# Patient Record
Sex: Female | Born: 1990 | Race: Black or African American | Hispanic: No | Marital: Single | Smoking: Never smoker
Health system: Southern US, Community
[De-identification: ages and names within clinical notes are randomized; demographics above are authoritative.]

## PROBLEM LIST (undated history)

## (undated) DIAGNOSIS — T7840XA Allergy, unspecified, initial encounter: Secondary | ICD-10-CM

## (undated) HISTORY — DX: Allergy, unspecified, initial encounter: T78.40XA

## (undated) HISTORY — PX: WISDOM TOOTH EXTRACTION: SHX21

---

## 2011-07-23 LAB — HM PAP SMEAR

## 2012-11-02 ENCOUNTER — Encounter: Payer: Self-pay | Admitting: *Deleted

## 2012-11-03 ENCOUNTER — Encounter: Payer: Self-pay | Admitting: Nurse Practitioner

## 2012-11-03 ENCOUNTER — Ambulatory Visit (INDEPENDENT_AMBULATORY_CARE_PROVIDER_SITE_OTHER): Payer: 59 | Admitting: Nurse Practitioner

## 2012-11-03 VITALS — BP 122/70 | HR 74 | Resp 16 | Ht 67.0 in | Wt 222.2 lb

## 2012-11-03 DIAGNOSIS — Z01419 Encounter for gynecological examination (general) (routine) without abnormal findings: Secondary | ICD-10-CM

## 2012-11-03 DIAGNOSIS — Z113 Encounter for screening for infections with a predominantly sexual mode of transmission: Secondary | ICD-10-CM

## 2012-11-03 DIAGNOSIS — Z309 Encounter for contraceptive management, unspecified: Secondary | ICD-10-CM

## 2012-11-03 DIAGNOSIS — IMO0001 Reserved for inherently not codable concepts without codable children: Secondary | ICD-10-CM

## 2012-11-03 DIAGNOSIS — Z0000 Encounter for general adult medical examination without abnormal findings: Secondary | ICD-10-CM

## 2012-11-03 LAB — POCT URINALYSIS DIPSTICK
Spec Grav, UA: 1.015
pH, UA: 6

## 2012-11-03 MED ORDER — NORETHINDRONE ACET-ETHINYL EST 1.5-30 MG-MCG PO TABS: 1.0000 | ORAL_TABLET | Freq: Every day | ORAL | Status: DC

## 2012-11-03 NOTE — Patient Instructions (Signed)
General topics  Next pap or exam is  due in 1 year Take a Women's multivitamin Take 1200 mg. of calcium daily - prefer dietary If any concerns in interim to call back  Breast Self-Awareness Practicing breast self-awareness may pick up problems early, prevent significant medical complications, and possibly save your life. By practicing breast self-awareness, you can become familiar with how your breasts look and feel and if your breasts are changing. This allows you to notice changes early. It can also offer you some reassurance that your breast health is good. One way to learn what is normal for your breasts and whether your breasts are changing is to do a breast self-exam. If you find a lump or something that was not present in the past, it is best to contact your caregiver right away. Other findings that should be evaluated by your caregiver include nipple discharge, especially if it is bloody; skin changes or reddening; areas where the skin seems to be pulled in (retracted); or new lumps and bumps. Breast pain is seldom associated with cancer (malignancy), but should also be evaluated by a caregiver. BREAST SELF-EXAM The best time to examine your breasts is 5 7 days after your menstrual period is over.  ExitCare Patient Information 2013 [employer].   Exercise to Stay Healthy Exercise helps you become and stay healthy. EXERCISE IDEAS AND TIPS Choose exercises that:  You enjoy.  Fit into your day. You do not need to exercise really hard to be healthy. You can do exercises at a slow or medium level and stay healthy. You can:  Stretch before and after working out.  Try yoga, Pilates, or tai chi.  Lift weights.  Walk fast, swim, jog, run, climb stairs, bicycle, dance, or rollerskate.  Take aerobic classes. Exercises that burn about 150 calories:  Running 1  miles in 15 minutes.  Playing volleyball for 45 to 60 minutes.  Washing and waxing a car for 45 to 60  minutes.  Playing touch football for 45 minutes.  Walking 1  miles in 35 minutes.  Pushing a stroller 1  miles in 30 minutes.  Playing basketball for 30 minutes.  Raking leaves for 30 minutes.  Bicycling 5 miles in 30 minutes.  Walking 2 miles in 30 minutes.  Dancing for 30 minutes.  Shoveling snow for 15 minutes.  Swimming laps for 20 minutes.  Walking up stairs for 15 minutes.  Bicycling 4 miles in 15 minutes.  Gardening for 30 to 45 minutes.  Jumping rope for 15 minutes.  Washing windows or floors for 45 to 60 minutes. Document Released: 07/13/2010 Document Revised: 09/02/2011 Document Reviewed: 07/13/2010 ExitCare Patient Information 2013 [employer].   Other topics ( that may be useful information):    Sexually Transmitted Disease Sexually transmitted disease (STD) refers to any infection that is passed from person to person during sexual activity. This may happen by way of saliva, semen, blood, vaginal mucus, or urine. Common STDs include:  Gonorrhea.  Chlamydia.  Syphilis.  HIV/AIDS.  Genital herpes.  Hepatitis B and C.  Trichomonas.  Human papillomavirus (HPV).  Pubic lice. CAUSES  An STD may be spread by bacteria, virus, or parasite. A person can get an STD by:  Sexual intercourse with an infected person.  Sharing sex toys with an infected person.  Sharing needles with an infected person.  Having intimate contact with the genitals, mouth, or rectal areas of an infected person. SYMPTOMS  Some people may not have any symptoms, but   they can still pass the infection to others. Different STDs have different symptoms. Symptoms include:  Painful or bloody urination.  Pain in the pelvis, abdomen, vagina, anus, throat, or eyes.  Skin rash, itching, irritation, growths, or sores (lesions). These usually occur in the genital or anal area.  Abnormal vaginal discharge.  Penile discharge in men.  Soft, flesh-colored skin growths in the  genital or anal area.  Fever.  Pain or bleeding during sexual intercourse.  Swollen glands in the groin area.  Yellow skin and eyes (jaundice). This is seen with hepatitis. DIAGNOSIS  To make a diagnosis, your caregiver may:  Take a medical history.  Perform a physical exam.  Take a specimen (culture) to be examined.  Examine a sample of discharge under a microscope.  Perform blood test TREATMENT   Chlamydia, gonorrhea, trichomonas, and syphilis can be cured with antibiotic medicine.  Genital herpes, hepatitis, and HIV can be treated, but not cured, with prescribed medicines. The medicines will lessen the symptoms.  Genital warts from HPV can be treated with medicine or by freezing, burning (electrocautery), or surgery. Warts may come back.  HPV is a virus and cannot be cured with medicine or surgery.However, abnormal areas may be followed very closely by your caregiver and may be removed from the cervix, vagina, or vulva through office procedures or surgery. If your diagnosis is confirmed, your recent sexual partners need treatment. This is true even if they are symptom-free or have a negative culture or evaluation. They should not have sex until their caregiver says it is okay. HOME CARE INSTRUCTIONS  All sexual partners should be informed, tested, and treated for all STDs.  Take your antibiotics as directed. Finish them even if you start to feel better.  Only take over-the-counter or prescription medicines for pain, discomfort, or fever as directed by your caregiver.  Rest.  Eat a balanced diet and drink enough fluids to keep your urine clear or pale yellow.  Do not have sex until treatment is completed and you have followed up with your caregiver. STDs should be checked after treatment.  Keep all follow-up appointments, Pap tests, and blood tests as directed by your caregiver.  Only use latex condoms and water-soluble lubricants during sexual activity. Do not use  petroleum jelly or oils.  Avoid alcohol and illegal drugs.  Get vaccinated for HPV and hepatitis. If you have not received these vaccines in the past, talk to your caregiver about whether one or both might be right for you.  Avoid risky sex practices that can break the skin. The only way to avoid getting an STD is to avoid all sexual activity.Latex condoms and dental dams (for oral sex) will help lessen the risk of getting an STD, but will not completely eliminate the risk. SEEK MEDICAL CARE IF:   You have a fever.  You have any new or worsening symptoms. Document Released: 08/31/2002 Document Revised: 09/02/2011 Document Reviewed: 09/07/2010 ExitCare Patient Information 2013 [employer].    Domestic Abuse You are being battered or abused if someone close to you hits, pushes, or physically hurts you in any way. You also are being abused if you are forced into activities. You are being sexually abused if you are forced to have sexual contact of any kind. You are being emotionally abused if you are made to feel worthless or if you are constantly threatened. It is important to remember that help is available. No one has the right to abuse you. PREVENTION OF FURTHER   ABUSE  Learn the warning signs of danger. This varies with situations but may include: the use of alcohol, threats, isolation from friends and family, or forced sexual contact. Leave if you feel that violence is going to occur.  If you are attacked or beaten, report it to the police so the abuse is documented. You do not have to press charges. The police can protect you while you or the attackers are leaving. Get the officer's name and badge number and a copy of the report.  Find someone you can trust and tell them what is happening to you: your caregiver, a nurse, clergy member, close friend or family member. Feeling ashamed is natural, but remember that you have done nothing wrong. No one deserves abuse. Document Released:  06/07/2000 Document Revised: 09/02/2011 Document Reviewed: 08/16/2010 ExitCare Patient Information 2013 [employer].    How Much is Too Much Alcohol? Drinking too much alcohol can cause injury, accidents, and health problems. These types of problems can include:   Car crashes.  Falls.  Family fighting (domestic violence).  Drowning.  Fights.  Injuries.  Burns.  Damage to certain organs.  Having a baby with birth defects. ONE DRINK CAN BE TOO MUCH WHEN YOU ARE:  Working.  Pregnant or breastfeeding.  Taking medicines. Ask your doctor.  Driving or planning to drive. If you or someone you know has a drinking problem, get help from a doctor.  Document Released: 04/06/2009 Document Revised: 09/02/2011 Document Reviewed: 04/06/2009 ExitCare Patient Information 2013 [employer].   Smoking Hazards Smoking cigarettes is extremely bad for your health. Tobacco smoke has over 200 known poisons in it. There are over 60 chemicals in tobacco smoke that cause cancer. Some of the chemicals found in cigarette smoke include:   Cyanide.  Benzene.  Formaldehyde.  Methanol (wood alcohol).  Acetylene (fuel used in welding torches).  Ammonia. Cigarette smoke also contains the poisonous gases nitrogen oxide and carbon monoxide.  Cigarette smokers have an increased risk of many serious medical problems and Smoking causes approximately:  90% of all lung cancer deaths in men.  80% of all lung cancer deaths in women.  90% of deaths from chronic obstructive lung disease. Compared with nonsmokers, smoking increases the risk of:  Coronary heart disease by 2 to 4 times.  Stroke by 2 to 4 times.  Men developing lung cancer by 23 times.  Women developing lung cancer by 13 times.  Dying from chronic obstructive lung diseases by 12 times.  . Smoking is the most preventable cause of death and disease in our society.  WHY IS SMOKING ADDICTIVE?  Nicotine is the chemical  agent in tobacco that is capable of causing addiction or dependence.  When you smoke and inhale, nicotine is absorbed rapidly into the bloodstream through your lungs. Nicotine absorbed through the lungs is capable of creating a powerful addiction. Both inhaled and non-inhaled nicotine may be addictive.  Addiction studies of cigarettes and spit tobacco show that addiction to nicotine occurs mainly during the teen years, when young people begin using tobacco products. WHAT ARE THE BENEFITS OF QUITTING?  There are many health benefits to quitting smoking.   Likelihood of developing cancer and heart disease decreases. Health improvements are seen almost immediately.  Blood pressure, pulse rate, and breathing patterns start returning to normal soon after quitting. QUITTING SMOKING   American Lung Association - 1-800-LUNGUSA  American Cancer Society - 1-800-ACS-2345 Document Released: 07/18/2004 Document Revised: 09/02/2011 Document Reviewed: 03/22/2009 ExitCare Patient Information 2013 ExitCare[employer]   LL[employer].   Stress Management Stress is a state of physical or mental tension that often results from changes in your life or normal routine. Some common causes of stress are:  Death of a loved one.  Injuries or severe illnesses.  Getting fired or changing jobs.  Moving into a new home. Other causes may be:  Sexual problems.  Business or financial losses.  Taking on a large debt.  Regular conflict with someone at home or at work.  Constant tiredness from lack of sleep. It is not just bad things that are stressful. It may be stressful to:  Win the lottery.  Get married.  Buy a new car. The amount of stress that can be easily tolerated varies from person to person. Changes generally cause stress, regardless of the types of change. Too much stress can affect your health. It may lead to physical or emotional problems. Too little stress (boredom) may also become stressful. SUGGESTIONS TO  REDUCE STRESS:  Talk things over with your family and friends. It often is helpful to share your concerns and worries. If you feel your problem is serious, you may want to get help from a professional counselor.  Consider your problems one at a time instead of lumping them all together. Trying to take care of everything at once may seem impossible. List all the things you need to do and then start with the most important one. Set a goal to accomplish 2 or 3 things each day. If you expect to do too many in a single day you will naturally fail, causing you to feel even more stressed.  Do not use alcohol or drugs to relieve stress. Although you may feel better for a short time, they do not remove the problems that caused the stress. They can also be habit forming.  Exercise regularly - at least 3 times per week. Physical exercise can help to relieve that "uptight" feeling and will relax you.  The shortest distance between despair and hope is often a good night's sleep.  Go to bed and get up on time allowing yourself time for appointments without being rushed.  Take a short "time-out" period from any stressful situation that occurs during the day. Close your eyes and take some deep breaths. Starting with the muscles in your face, tense them, hold it for a few seconds, then relax. Repeat this with the muscles in your neck, shoulders, hand, stomach, back and legs.  Take good care of yourself. Eat a balanced diet and get plenty of rest.  Schedule time for having fun. Take a break from your daily routine to relax. HOME CARE INSTRUCTIONS   Call if you feel overwhelmed by your problems and feel you can no longer manage them on your own.  Return immediately if you feel like hurting yourself or someone else. Document Released: 12/04/2000 Document Revised: 09/02/2011 Document Reviewed: 07/27/2007 ExitCare Patient Information 2013 [employer].   

## 2012-11-03 NOTE — Progress Notes (Signed)
22 y.o. G0P0 Single African American Fe here for annual exam.  Menses are regular lasting about 4 days. Same partner for 5 years.   Patient's last menstrual period was 10/21/2012.          Sexually active: yes  The current method of family planning is OCP (estrogen/progesterone).    Exercising: no  The patient does not participate in regular exercise at present. Smoker:  no  Health Maintenance: Pap:  07/23/2011  ASCUS with negative HPV TDaP:  2009 Gardasil completed 12/2010 Labs: Hgb-12.1   does not have a smoking history on file. She has never used smokeless tobacco. She reports that she drinks about 0.6 ounces of alcohol per week. She reports that she does not use illicit drugs.  History reviewed. No pertinent past medical history.  Past Surgical History  Procedure Laterality Date  . Wisdom tooth extraction      Current Outpatient Prescriptions  Medication Sig Dispense Refill  . Norethindrone Acetate-Ethinyl Estradiol (LOESTRIN 1.5/30, 21,) 1.5-30 MG-MCG tablet Take 1 tablet by mouth daily.       No current facility-administered medications for this visit.    Family History  Problem Relation Age of Onset  . Cancer Mother   . Hypertension Father     ROS:  Pertinent items are noted in HPI.  Otherwise, a comprehensive ROS was negative.  Exam:   BP 122/70  Pulse 74  Resp 16  Ht 5\' 7"  (1.702 m)  Wt 222 lb 3.2 oz (100.789 kg)  BMI 34.79 kg/m2  LMP 10/21/2012 Height: 5\' 7"  (170.2 cm)  Ht Readings from Last 3 Encounters:  11/03/12 5\' 7"  (1.702 m)    General appearance: alert, cooperative and appears stated age Head: Normocephalic, without obvious abnormality, atraumatic Neck: no adenopathy, supple, symmetrical, trachea midline and thyroid normal to inspection and palpation Lungs: clear to auscultation bilaterally Breasts: normal appearance, no masses or tenderness Heart: regular rate and rhythm Abdomen: soft, non-tender; no masses,  no organomegaly Extremities:  extremities normal, atraumatic, no cyanosis or edema Skin: Skin color, texture, turgor normal. No rashes or lesions Lymph nodes: Cervical, supraclavicular, and axillary nodes normal. No abnormal inguinal nodes palpated Neurologic: Grossly normal   Pelvic: External genitalia:  no lesions              Urethra:  normal appearing urethra with no masses, tenderness or lesions              Bartholin's and Skene's: normal                 Vagina: normal appearing vagina with normal color and discharge, no lesions              Cervix: anteverted              Pap taken: yes Bimanual Exam:  Uterus:  normal size, contour, position, consistency, mobility, non-tender              Adnexa: no mass, fullness, tenderness               Rectovaginal: Confirms               Anus:  normal sphincter tone, no lesions  A:  Well Woman with normal exam  Contraception  R/O STD's  History of ASCUS with neg HR HPV 06/2011  P:   Pap smear as per guidelines   Call with labs  Refilled OCP - Loestrin for 1 year  return annually or prn  An After  Visit Summary was printed and given to the patient.

## 2012-11-04 LAB — STD PANEL
HIV: NONREACTIVE
Hepatitis B Surface Ag: NEGATIVE

## 2012-11-04 NOTE — Progress Notes (Signed)
Encounter reviewed by Dr. Brook Silva.  

## 2012-11-05 ENCOUNTER — Telehealth: Payer: Self-pay | Admitting: *Deleted

## 2012-11-05 LAB — IPS N GONORRHOEA AND CHLAMYDIA BY PCR

## 2012-11-05 NOTE — Telephone Encounter (Signed)
Message copied by Osie Bond on Thu Nov 05, 2012  2:58 PM ------      Message from: Ria Comment R      Created: Thu Nov 05, 2012  8:25 AM       Hold for [facility] ------

## 2012-11-05 NOTE — Telephone Encounter (Signed)
Pt is aware of all negative results.  

## 2012-11-10 LAB — IPS PAP TEST WITH REFLEX TO HPV

## 2013-05-10 ENCOUNTER — Ambulatory Visit: Payer: 59 | Admitting: Family Medicine

## 2013-05-10 VITALS — BP 120/70 | HR 103 | Temp 99.1°F | Resp 18 | Ht 67.5 in | Wt 258.0 lb

## 2013-05-10 DIAGNOSIS — J029 Acute pharyngitis, unspecified: Secondary | ICD-10-CM

## 2013-05-10 DIAGNOSIS — J039 Acute tonsillitis, unspecified: Secondary | ICD-10-CM

## 2013-05-10 DIAGNOSIS — R1310 Dysphagia, unspecified: Secondary | ICD-10-CM

## 2013-05-10 LAB — POCT RAPID STREP A (OFFICE): Rapid Strep A Screen: NEGATIVE

## 2013-05-10 MED ORDER — LIDOCAINE VISCOUS 2 % MT SOLN: 10.0000 mL | OROMUCOSAL | Status: DC | PRN

## 2013-05-10 MED ORDER — AMOXICILLIN 500 MG PO CAPS: 500.0000 mg | ORAL_CAPSULE | Freq: Three times a day (TID) | ORAL | Status: DC

## 2013-05-10 MED ORDER — METHYLPREDNISOLONE (PAK) 4 MG PO TABS: ORAL_TABLET | ORAL | Status: DC

## 2013-05-10 NOTE — Progress Notes (Signed)
[facility]:  Office Visit  Chief Complaint:  Chief Complaint  Patient presents with  . Sore Throat    x 2 days    HPI: Claudia Powers is a 22 y.o. female who is here for  4 day history of sore throat , deneis fevers, cough, dysphagia and also odynophagia with solids and liquids, no facial pain, + ear pressure/pop/pain x 2 days. Has tried cold and flu itc meds and also lozenges, without releif. Gargled with salt water. 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 Nonsmoker, + allergies, no asthma.   History reviewed. No pertinent past medical history. Past Surgical History  Procedure Laterality Date  . Wisdom tooth extraction     History   Social History  . Marital Status: Single    Spouse Name: N/A    Number of Children: N/A  . Years of Education: N/A   Social History Main Topics  . Smoking status: Never Smoker   . Smokeless tobacco: Never Used  . Alcohol Use: 0.6 oz/week    1 Shots of liquor per week  . Drug Use: No  . Sexual Activity: No   Other Topics Concern  . None   Social History Narrative  . None   Family History  Problem Relation Age of Onset  . Cancer Mother 70    Uterine cancer, PE  . Hypertension Father   . Bladder Cancer Father 67  . Breast cancer Maternal Grandmother    No Known Allergies Prior to Admission medications   Medication Sig Start Date End Date Taking? Authorizing Provider  Norethindrone Acetate-Ethinyl Estradiol (LOESTRIN 1.5/30, 21,) 1.5-30 MG-MCG tablet Take 1 tablet by mouth daily. 11/03/12   Lauro Franklin, FNP     ROS: The patient denies fevers, chills, night sweats, unintentional weight loss, chest pain, palpitations, wheezing, dyspnea on exertion, nausea, vomiting, abdominal pain, dysuria, hematuria, melena, numbness, weakness, or tingling.   All other systems have been reviewed and were otherwise negative with the exception of those mentioned in the HPI and as above.    PHYSICAL EXAM: Filed Vitals:    05/10/13 1157  BP: 120/70  Pulse: 103  Temp: 99.1 F (37.3 C)  Resp: 18   Filed Vitals:   05/10/13 1157  Height: 5' 7.5" (1.715 m)  Weight: 258 lb (117.028 kg)   Body mass index is 39.79 kg/(m^2).  General: Alert, no acute distress HEENT:  Normocephalic, atraumatic, oropharynx patent. EOMI, PERRLA. NO exudates, + debris, + enlarged +3 tonsils, almost touching but without SOB, breathing problems.  Cardiovascular:  Regular rate and rhythm, no rubs murmurs or gallops.  No Carotid bruits, radial pulse intact. No pedal edema.  Respiratory: Clear to auscultation bilaterally.  No wheezes, rales, or rhonchi.  No cyanosis, no use of accessory musculature GI: No organomegaly, abdomen is soft and non-tender, positive bowel sounds.  No masses. Skin: No rashes. Neurologic: Facial musculature symmetric. Psychiatric: Patient is appropriate throughout our interaction. Lymphatic: No cervical lymphadenopathy Musculoskeletal: Gait intact.   LABS: Results for orders placed in visit on 05/10/13  POCT RAPID STREP A (OFFICE)      Result Value Range   Rapid Strep A Screen Negative  Negative     EKG/XRAY:   Primary read interpreted by Dr. Conley Rolls at [facility].   ASSESSMENT/PLAN: Encounter Diagnoses  Name Primary?  . Acute pharyngitis Yes  . Tonsillitis   . Odynophagia    Obese AA female with enlarged 3+ tonsils Rx Amox, Medril dose pack, and viscous lidocaine  prn Throat cx pending F/u prn  Gross sideeffects, risk and benefits, and alternatives of medications d/w patient. Patient is aware that all medications have potential sideeffects and we are unable to predict every sideeffect or drug-drug interaction that may occur.  Perez Dirico PHUONG, DO 05/10/2013 1:00 PM

## 2013-05-10 NOTE — Patient Instructions (Signed)
Viral and Bacterial Pharyngitis Pharyngitis is soreness (inflammation) or infection of the pharynx. It is also called a sore throat. CAUSES  Most sore throats are caused by viruses and are part of a cold. However, some sore throats are caused by strep and other bacteria. Sore throats can also be caused by post nasal drip from draining sinuses, allergies and sometimes from sleeping with an open mouth. Infectious sore throats can be spread from person to person by coughing, sneezing and sharing cups or eating utensils. TREATMENT  Sore throats that are viral usually last 3-4 days. Viral illness will get better without medications (antibiotics). Strep throat and other bacterial infections will usually begin to get better about 24-48 hours after you begin to take antibiotics. HOME CARE INSTRUCTIONS   If the caregiver feels there is a bacterial infection or if there is a positive strep test, they will prescribe an antibiotic. The full course of antibiotics must be taken. If the full course of antibiotic is not taken, you or your child may become ill again. If you or your child has strep throat and do not finish all of the medication, serious heart or kidney diseases may develop.  Drink enough water and fluids to keep your urine clear or pale yellow.  Only take over-the-counter or prescription medicines for pain, discomfort or fever as directed by your caregiver.  Get lots of rest.  Gargle with salt water ( tsp. of salt in a glass of water) as often as every 1-2 hours as you need for comfort.  Hard candies may soothe the throat if individual is not at risk for choking. Throat sprays or lozenges may also be used. SEEK MEDICAL CARE IF:   Large, tender lumps in the neck develop.  A rash develops.  Green, yellow-brown or bloody sputum is coughed up.  Your baby is older than 3 months with a rectal temperature of 100.5 F (38.1 C) or higher for more than 1 day. SEEK IMMEDIATE MEDICAL CARE IF:   A  stiff neck develops.  You or your child are drooling or unable to swallow liquids.  You or your child are vomiting, unable to keep medications or liquids down.  You or your child has severe pain, unrelieved with recommended medications.  You or your child are having difficulty breathing (not due to stuffy nose).  You or your child are unable to fully open your mouth.  You or your child develop redness, swelling, or severe pain anywhere on the neck.  You have a fever.  Your baby is older than 3 months with a rectal temperature of 102 F (38.9 C) or higher.  Your baby is 3 months old or younger with a rectal temperature of 100.4 F (38 C) or higher. MAKE SURE YOU:   Understand these instructions.  Will watch your condition.  Will get help right away if you are not doing well or get worse. Document Released: 06/10/2005 Document Revised: 09/02/2011 Document Reviewed: 09/07/2007 ExitCare Patient Information 2014 [employer].  

## 2013-05-10 NOTE — Addendum Note (Signed)
Addended by: Sheppard Plumber A on: 05/10/2013 01:09 PM   Modules accepted: Level of Service

## 2013-05-12 LAB — CULTURE, GROUP A STREP: Organism ID, Bacteria: NORMAL

## 2013-06-05 ENCOUNTER — Ambulatory Visit: Payer: 59 | Admitting: Internal Medicine

## 2013-06-05 ENCOUNTER — Ambulatory Visit: Payer: 59

## 2013-06-05 VITALS — BP 140/80 | HR 135 | Temp 99.2°F | Resp 16 | Ht 67.7 in | Wt 261.8 lb

## 2013-06-05 DIAGNOSIS — I471 Supraventricular tachycardia, unspecified: Secondary | ICD-10-CM

## 2013-06-05 DIAGNOSIS — R002 Palpitations: Secondary | ICD-10-CM

## 2013-06-05 DIAGNOSIS — R079 Chest pain, unspecified: Secondary | ICD-10-CM

## 2013-06-05 LAB — POCT CBC
Granulocyte percent: 82.5 %G — AB (ref 37–80)
Hemoglobin: 12 g/dL — AB (ref 12.2–16.2)
Lymph, poc: 2 (ref 0.6–3.4)
MCHC: 29.7 g/dL — AB (ref 31.8–35.4)
MID (cbc): 0.4 (ref 0–0.9)
MPV: 9.7 fL (ref 0–99.8)
POC Granulocyte: 11.1 — AB (ref 2–6.9)
POC MID %: 2.8 %M (ref 0–12)
RBC: 4.58 M/uL (ref 4.04–5.48)
RDW, POC: 15 %

## 2013-06-05 LAB — COMPLETE METABOLIC PANEL WITH GFR
ALT: 11 U/L (ref 0–35)
Albumin: 3.9 g/dL (ref 3.5–5.2)
Alkaline Phosphatase: 75 U/L (ref 39–117)
CO2: 24 mEq/L (ref 19–32)
Calcium: 9.2 mg/dL (ref 8.4–10.5)
GFR, Est African American: >89 mL/min
GFR, Est Non African American: >89 mL/min
Potassium: 4.4 mEq/L (ref 3.5–5.3)
Sodium: 137 mEq/L (ref 135–145)
Total Bilirubin: 0.3 mg/dL (ref 0.3–1.2)
Total Protein: 7.2 g/dL (ref 6.0–8.3)

## 2013-06-05 NOTE — Progress Notes (Signed)
Subjective:    Patient ID: Claudia Powers, female    DOB: April 22, 1991, 22 y.o.   MRN: [id number]  HPI Complaining of the abrupt onset of a rapid heartbeat with anxiety, sweating, and shortness of breath intermittently over the past 24 hours  This is associated with a pressure feeling in the center part of her chest. She admits only 2 hours of sleep last night as she is preparing for a final exam which is due at midnight tonight .she feels very anxious about this  Her only cardiac risk factors are her weight  Family history is negative for cardiac disease or hypercoagulability -her mother had pulmonary emboli but was studied and felt to be negative for inherited coagulopathy   No recent illnesses, fever, cough, leg swelling   Review of Systems  Constitutional: Negative for fever, activity change, appetite change and unexpected weight change.  Respiratory: Negative for apnea, cough, choking and wheezing.   Cardiovascular: Negative for leg swelling.  Gastrointestinal: Negative for abdominal pain.  Neurological: Negative for headaches.  Psychiatric/Behavioral: Negative for dysphoric mood.       Objective:   Physical Exam  Constitutional: She is oriented to person, place, and time. She appears well-developed. No distress.  Significantly obese Anxious  HENT:  Head: Normocephalic.  Nose: Nose normal.  Mouth/Throat: Oropharynx is clear and moist. No oropharyngeal exudate.  Eyes: Conjunctivae and EOM are normal. Pupils are equal, round, and reactive to light.  Neck: Neck supple. No thyromegaly present.  Cardiovascular: Normal heart sounds and intact distal pulses.  Exam reveals no gallop and no friction rub.   No murmur heard. Sinus rates her 100 and regular  Pulmonary/Chest: Effort normal and breath sounds normal. No respiratory distress. She has no wheezes.  Abdominal: Soft. She exhibits no distension. There is no tenderness.  Musculoskeletal: She exhibits no edema.    Lymphadenopathy:    She has no cervical adenopathy.  Neurological: She is oriented to person, place, and time. She has normal reflexes. No cranial nerve deficit.  Skin: No rash noted.  Psychiatric: Judgment and thought content normal.   no cords or masses in the lower extremity and no tenderness to palpation  BP 140/80  Pulse 135  Temp(Src) 99.2 F (37.3 C) (Oral)  Resp 16  Ht 5' 7.7" (1.72 m)  Wt 261 lb 12.8 oz (118.752 kg)  BMI 40.14 kg/m2  SpO2 100%  LMP 05/06/2013 Initial EKG done with her tachycardia at presentation suggested sinus tachycardia although the computer question whether there was atrial flutter/no flutter waves were visible to me Her pulse decreased markedly in the supine position after examination and reassurance and repeat EKG was normal       Results for orders placed in visit on 06/05/13  POCT CBC      Result Value Range   WBC 13.5 (*) 4.6 - 10.2 K/uL   Lymph, poc 2.0  0.6 - 3.4   POC LYMPH PERCENT 14.7  10 - 50 %L   MID (cbc) 0.4  0 - 0.9   POC MID % 2.8  0 - 12 %M   POC Granulocyte 11.1 (*) 2 - 6.9   Granulocyte percent 82.5 (*) 37 - 80 %G   RBC 4.58  4.04 - 5.48 M/uL   Hemoglobin 12.0 (*) 12.2 - 16.2 g/dL   HCT, POC 32.4  40.1 - 47.9 %   MCV 88.2  80 - 97 fL   MCH, POC 26.2 (*) 27 - 31.2 pg   MCHC  29.7 (*) 31.8 - 35.4 g/dL   RDW, POC 21.3     Platelet Count, POC 363  142 - 424 K/uL   MPV 9.7  0 - 99.8 fL  [facility] reading (PRIMARY) by  Dr. Josephina Gip infiltr/effusion      Assessment & Plan:  Palpitations - Plan: EKG 12-Lead, DG Chest 2 View, POCT CBC, COMPLETE METABOLIC PANEL WITH GFR, TSH  Paroxysmal supraventricular tachycardia  Chest pain, unspecified - Plan: DG Chest 2 View, POCT CBC, COMPLETE METABOLIC PANEL WITH GFR, TSH   leukocytosis of unclear etiology  Reassured/needs sleep and rest after completing exams and reevaluation if her symptoms do not disappear

## 2013-06-06 ENCOUNTER — Encounter: Payer: Self-pay | Admitting: Internal Medicine

## 2013-06-21 ENCOUNTER — Encounter: Payer: Self-pay | Admitting: Nurse Practitioner

## 2013-10-27 ENCOUNTER — Other Ambulatory Visit: Payer: Self-pay | Admitting: *Deleted

## 2013-10-27 DIAGNOSIS — IMO0001 Reserved for inherently not codable concepts without codable children: Secondary | ICD-10-CM

## 2013-10-27 MED ORDER — NORETHINDRONE ACET-ETHINYL EST 1.5-30 MG-MCG PO TABS: 1.0000 | ORAL_TABLET | Freq: Every day | ORAL | Status: DC

## 2013-10-27 NOTE — Telephone Encounter (Signed)
Incoming fax requesting Junel refill Last AEX and refill 11/03/12 #3/ 3 refills Next appt 11/16/13  Will refill once until appt.

## 2013-11-05 ENCOUNTER — Ambulatory Visit: Payer: 59 | Admitting: Nurse Practitioner

## 2013-11-16 ENCOUNTER — Ambulatory Visit (INDEPENDENT_AMBULATORY_CARE_PROVIDER_SITE_OTHER): Payer: 59 | Admitting: Nurse Practitioner

## 2013-11-16 ENCOUNTER — Encounter: Payer: Self-pay | Admitting: Nurse Practitioner

## 2013-11-16 VITALS — BP 104/62 | HR 72 | Ht 66.5 in | Wt 253.0 lb

## 2013-11-16 DIAGNOSIS — Z0000 Encounter for general adult medical examination without abnormal findings: Secondary | ICD-10-CM

## 2013-11-16 DIAGNOSIS — R82998 Other abnormal findings in urine: Secondary | ICD-10-CM

## 2013-11-16 DIAGNOSIS — R8290 Unspecified abnormal findings in urine: Secondary | ICD-10-CM

## 2013-11-16 DIAGNOSIS — IMO0001 Reserved for inherently not codable concepts without codable children: Secondary | ICD-10-CM

## 2013-11-16 DIAGNOSIS — Z01419 Encounter for gynecological examination (general) (routine) without abnormal findings: Secondary | ICD-10-CM

## 2013-11-16 DIAGNOSIS — Z309 Encounter for contraceptive management, unspecified: Secondary | ICD-10-CM

## 2013-11-16 LAB — POCT URINALYSIS DIPSTICK
Glucose, UA: NEGATIVE
Ketones, UA: NEGATIVE
Nitrite, UA: NEGATIVE
PH UA: 6
Protein, UA: NEGATIVE
UROBILINOGEN UA: NEGATIVE

## 2013-11-16 LAB — HEMOGLOBIN, FINGERSTICK: Hemoglobin, fingerstick: 11.8 g/dL — ABNORMAL LOW (ref 12.0–16.0)

## 2013-11-16 MED ORDER — NORETHINDRONE ACET-ETHINYL EST 1.5-30 MG-MCG PO TABS: 1.0000 | ORAL_TABLET | Freq: Every day | ORAL | Status: DC

## 2013-11-16 NOTE — Patient Instructions (Signed)
General topics  Next pap or exam is  due in 1 year Take a Women's multivitamin Take 1200 mg. of calcium daily - prefer dietary If any concerns in interim to call back  Breast Self-Awareness Practicing breast self-awareness may pick up problems early, prevent significant medical complications, and possibly save your life. By practicing breast self-awareness, you can become familiar with how your breasts look and feel and if your breasts are changing. This allows you to notice changes early. It can also offer you some reassurance that your breast health is good. One way to learn what is normal for your breasts and whether your breasts are changing is to do a breast self-exam. If you find a lump or something that was not present in the past, it is best to contact your caregiver right away. Other findings that should be evaluated by your caregiver include nipple discharge, especially if it is bloody; skin changes or reddening; areas where the skin seems to be pulled in (retracted); or new lumps and bumps. Breast pain is seldom associated with cancer (malignancy), but should also be evaluated by a caregiver. BREAST SELF-EXAM The best time to examine your breasts is 5 7 days after your menstrual period is over.  ExitCare Patient Information 2013 [employer].   Exercise to Stay Healthy Exercise helps you become and stay healthy. EXERCISE IDEAS AND TIPS Choose exercises that:  You enjoy.  Fit into your day. You do not need to exercise really hard to be healthy. You can do exercises at a slow or medium level and stay healthy. You can:  Stretch before and after working out.  Try yoga, Pilates, or tai chi.  Lift weights.  Walk fast, swim, jog, run, climb stairs, bicycle, dance, or rollerskate.  Take aerobic classes. Exercises that burn about 150 calories:  Running 1  miles in 15 minutes.  Playing volleyball for 45 to 60 minutes.  Washing and waxing a car for 45 to 60  minutes.  Playing touch football for 45 minutes.  Walking 1  miles in 35 minutes.  Pushing a stroller 1  miles in 30 minutes.  Playing basketball for 30 minutes.  Raking leaves for 30 minutes.  Bicycling 5 miles in 30 minutes.  Walking 2 miles in 30 minutes.  Dancing for 30 minutes.  Shoveling snow for 15 minutes.  Swimming laps for 20 minutes.  Walking up stairs for 15 minutes.  Bicycling 4 miles in 15 minutes.  Gardening for 30 to 45 minutes.  Jumping rope for 15 minutes.  Washing windows or floors for 45 to 60 minutes. Document Released: 07/13/2010 Document Revised: 09/02/2011 Document Reviewed: 07/13/2010 ExitCare Patient Information 2013 [employer].   Other topics ( that may be useful information):    Sexually Transmitted Disease Sexually transmitted disease (STD) refers to any infection that is passed from person to person during sexual activity. This may happen by way of saliva, semen, blood, vaginal mucus, or urine. Common STDs include:  Gonorrhea.  Chlamydia.  Syphilis.  HIV/AIDS.  Genital herpes.  Hepatitis B and C.  Trichomonas.  Human papillomavirus (HPV).  Pubic lice. CAUSES  An STD may be spread by bacteria, virus, or parasite. A person can get an STD by:  Sexual intercourse with an infected person.  Sharing sex toys with an infected person.  Sharing needles with an infected person.  Having intimate contact with the genitals, mouth, or rectal areas of an infected person. SYMPTOMS  Some people may not have any symptoms, but   they can still pass the infection to others. Different STDs have different symptoms. Symptoms include:  Painful or bloody urination.  Pain in the pelvis, abdomen, vagina, anus, throat, or eyes.  Skin rash, itching, irritation, growths, or sores (lesions). These usually occur in the genital or anal area.  Abnormal vaginal discharge.  Penile discharge in men.  Soft, flesh-colored skin growths in the  genital or anal area.  Fever.  Pain or bleeding during sexual intercourse.  Swollen glands in the groin area.  Yellow skin and eyes (jaundice). This is seen with hepatitis. DIAGNOSIS  To make a diagnosis, your caregiver may:  Take a medical history.  Perform a physical exam.  Take a specimen (culture) to be examined.  Examine a sample of discharge under a microscope.  Perform blood test TREATMENT   Chlamydia, gonorrhea, trichomonas, and syphilis can be cured with antibiotic medicine.  Genital herpes, hepatitis, and HIV can be treated, but not cured, with prescribed medicines. The medicines will lessen the symptoms.  Genital warts from HPV can be treated with medicine or by freezing, burning (electrocautery), or surgery. Warts may come back.  HPV is a virus and cannot be cured with medicine or surgery.However, abnormal areas may be followed very closely by your caregiver and may be removed from the cervix, vagina, or vulva through office procedures or surgery. If your diagnosis is confirmed, your recent sexual partners need treatment. This is true even if they are symptom-free or have a negative culture or evaluation. They should not have sex until their caregiver says it is okay. HOME CARE INSTRUCTIONS  All sexual partners should be informed, tested, and treated for all STDs.  Take your antibiotics as directed. Finish them even if you start to feel better.  Only take over-the-counter or prescription medicines for pain, discomfort, or fever as directed by your caregiver.  Rest.  Eat a balanced diet and drink enough fluids to keep your urine clear or pale yellow.  Do not have sex until treatment is completed and you have followed up with your caregiver. STDs should be checked after treatment.  Keep all follow-up appointments, Pap tests, and blood tests as directed by your caregiver.  Only use latex condoms and water-soluble lubricants during sexual activity. Do not use  petroleum jelly or oils.  Avoid alcohol and illegal drugs.  Get vaccinated for HPV and hepatitis. If you have not received these vaccines in the past, talk to your caregiver about whether one or both might be right for you.  Avoid risky sex practices that can break the skin. The only way to avoid getting an STD is to avoid all sexual activity.Latex condoms and dental dams (for oral sex) will help lessen the risk of getting an STD, but will not completely eliminate the risk. SEEK MEDICAL CARE IF:   You have a fever.  You have any new or worsening symptoms. Document Released: 08/31/2002 Document Revised: 09/02/2011 Document Reviewed: 09/07/2010 [facility] Patient Information 2013 [employer].    Domestic Abuse You are being battered or abused if someone close to you hits, pushes, or physically hurts you in any way. You also are being abused if you are forced into activities. You are being sexually abused if you are forced to have sexual contact of any kind. You are being emotionally abused if you are made to feel worthless or if you are constantly threatened. It is important to remember that help is available. No one has the right to abuse you. PREVENTION OF FURTHER  ABUSE  Learn the warning signs of danger. This varies with situations but may include: the use of alcohol, threats, isolation from friends and family, or forced sexual contact. Leave if you feel that violence is going to occur.  If you are attacked or beaten, report it to the police so the abuse is documented. You do not have to press charges. The police can protect you while you or the attackers are leaving. Get the officer's name and badge number and a copy of the report.  Find someone you can trust and tell them what is happening to you: your caregiver, a nurse, clergy member, close friend or family member. Feeling ashamed is natural, but remember that you have done nothing wrong. No one deserves abuse. Document Released:  06/07/2000 Document Revised: 09/02/2011 Document Reviewed: 08/16/2010 ExitCare Patient Information 2013 [employer].    How Much is Too Much Alcohol? Drinking too much alcohol can cause injury, accidents, and health problems. These types of problems can include:   Car crashes.  Falls.  Family fighting (domestic violence).  Drowning.  Fights.  Injuries.  Burns.  Damage to certain organs.  Having a baby with birth defects. ONE DRINK CAN BE TOO MUCH WHEN YOU ARE:  Working.  Pregnant or breastfeeding.  Taking medicines. Ask your doctor.  Driving or planning to drive. If you or someone you know has a drinking problem, get help from a doctor.  Document Released: 04/06/2009 Document Revised: 09/02/2011 Document Reviewed: 04/06/2009 ExitCare Patient Information 2013 [employer].   Smoking Hazards Smoking cigarettes is extremely bad for your health. Tobacco smoke has over 200 known poisons in it. There are over 60 chemicals in tobacco smoke that cause cancer. Some of the chemicals found in cigarette smoke include:   Cyanide.  Benzene.  Formaldehyde.  Methanol (wood alcohol).  Acetylene (fuel used in welding torches).  Ammonia. Cigarette smoke also contains the poisonous gases nitrogen oxide and carbon monoxide.  Cigarette smokers have an increased risk of many serious medical problems and Smoking causes approximately:  90% of all lung cancer deaths in men.  80% of all lung cancer deaths in women.  90% of deaths from chronic obstructive lung disease. Compared with nonsmokers, smoking increases the risk of:  Coronary heart disease by 2 to 4 times.  Stroke by 2 to 4 times.  Men developing lung cancer by 23 times.  Women developing lung cancer by 13 times.  Dying from chronic obstructive lung diseases by 12 times.  . Smoking is the most preventable cause of death and disease in our society.  WHY IS SMOKING ADDICTIVE?  Nicotine is the chemical  agent in tobacco that is capable of causing addiction or dependence.  When you smoke and inhale, nicotine is absorbed rapidly into the bloodstream through your lungs. Nicotine absorbed through the lungs is capable of creating a powerful addiction. Both inhaled and non-inhaled nicotine may be addictive.  Addiction studies of cigarettes and spit tobacco show that addiction to nicotine occurs mainly during the teen years, when young people begin using tobacco products. WHAT ARE THE BENEFITS OF QUITTING?  There are many health benefits to quitting smoking.   Likelihood of developing cancer and heart disease decreases. Health improvements are seen almost immediately.  Blood pressure, pulse rate, and breathing patterns start returning to normal soon after quitting. QUITTING SMOKING   American Lung Association - 1-800-LUNGUSA  American Cancer Society - 1-800-ACS-2345 Document Released: 07/18/2004 Document Revised: 09/02/2011 Document Reviewed: 03/22/2009 ExitCare Patient Information 2013 ExitCare,   LLC.   Stress Management Stress is a state of physical or mental tension that often results from changes in your life or normal routine. Some common causes of stress are:  Death of a loved one.  Injuries or severe illnesses.  Getting fired or changing jobs.  Moving into a new home. Other causes may be:  Sexual problems.  Business or financial losses.  Taking on a large debt.  Regular conflict with someone at home or at work.  Constant tiredness from lack of sleep. It is not just bad things that are stressful. It may be stressful to:  Win the lottery.  Get married.  Buy a new car. The amount of stress that can be easily tolerated varies from person to person. Changes generally cause stress, regardless of the types of change. Too much stress can affect your health. It may lead to physical or emotional problems. Too little stress (boredom) may also become stressful. SUGGESTIONS TO  REDUCE STRESS:  Talk things over with your family and friends. It often is helpful to share your concerns and worries. If you feel your problem is serious, you may want to get help from a professional counselor.  Consider your problems one at a time instead of lumping them all together. Trying to take care of everything at once may seem impossible. List all the things you need to do and then start with the most important one. Set a goal to accomplish 2 or 3 things each day. If you expect to do too many in a single day you will naturally fail, causing you to feel even more stressed.  Do not use alcohol or drugs to relieve stress. Although you may feel better for a short time, they do not remove the problems that caused the stress. They can also be habit forming.  Exercise regularly - at least 3 times per week. Physical exercise can help to relieve that "uptight" feeling and will relax you.  The shortest distance between despair and hope is often a good night's sleep.  Go to bed and get up on time allowing yourself time for appointments without being rushed.  Take a short "time-out" period from any stressful situation that occurs during the day. Close your eyes and take some deep breaths. Starting with the muscles in your face, tense them, hold it for a few seconds, then relax. Repeat this with the muscles in your neck, shoulders, hand, stomach, back and legs.  Take good care of yourself. Eat a balanced diet and get plenty of rest.  Schedule time for having fun. Take a break from your daily routine to relax. HOME CARE INSTRUCTIONS   Call if you feel overwhelmed by your problems and feel you can no longer manage them on your own.  Return immediately if you feel like hurting yourself or someone else. Document Released: 12/04/2000 Document Revised: 09/02/2011 Document Reviewed: 07/27/2007 ExitCare Patient Information 2013 ExitCare[employer].  

## 2013-11-16 NOTE — Progress Notes (Signed)
Patient ID: Claudia Powers, female   DOB: 1990/10/08, 23 y.o.   MRN: [id number] 23 y.o. G0P0 Single African American Fe here for annual exam.  Menses usually last 4 days.  Moderate to light.  Same partner for 6 years.    This past Saturday had light pink spotting on condom and only with wiping, nothing since then.  Patient's last menstrual period was 11/03/2013.          Sexually active: yes  The current method of family planning is OCP (estrogen/progesterone) and condoms all the time.    Exercising: yes  Gym/ health club routine includes personal trainer 2 times per week and cardio 2-3 times per week. Smoker:  no  Health Maintenance: Pap: 11/03/12, WNL TDaP: 2009  Gardasil completed 12/2010  Labs:  HB:  11.8  Urine:  Trace leuk's, trace RBC   reports that she has never smoked. She has never used smokeless tobacco. She reports that she drinks about .5 ounces of alcohol per week. She reports that she does not use illicit drugs.  History reviewed. No pertinent past medical history.  Past Surgical History  Procedure Laterality Date  . Wisdom tooth extraction      Current Outpatient Prescriptions  Medication Sig Dispense Refill  . loratadine (CLARITIN) 10 MG tablet Take 10 mg by mouth daily.      . Norethindrone Acetate-Ethinyl Estradiol (LOESTRIN 1.5/30, 21,) 1.5-30 MG-MCG tablet Take 1 tablet by mouth daily.  1 Package  0   No current facility-administered medications for this visit.    Family History  Problem Relation Age of Onset  . Cancer Mother 60    Uterine cancer, PE  . Hypertension Father   . Bladder Cancer Father 66  . Breast cancer Maternal Grandmother     ROS:  Pertinent items are noted in HPI.  Otherwise, a comprehensive ROS was negative.  Exam:   BP 104/62  Pulse 72  Ht 5' 6.5" (1.689 m)  Wt 253 lb (114.76 kg)  BMI 40.23 kg/m2  LMP 11/03/2013 Height: 5' 6.5" (168.9 cm)  Ht Readings from Last 3 Encounters:  11/16/13 5' 6.5" (1.689 m)  06/05/13 5' 7.7" (1.72  m)  05/10/13 5' 7.5" (1.715 m)    General appearance: alert, cooperative and appears stated age Head: Normocephalic, without obvious abnormality, atraumatic Neck: no adenopathy, supple, symmetrical, trachea midline and thyroid normal to inspection and palpation Lungs: clear to auscultation bilaterally Breasts: normal appearance, no masses or tenderness, very large and pendulous Heart: regular rate and rhythm Abdomen: soft, non-tender; no masses,  no organomegaly Extremities: extremities normal, atraumatic, no cyanosis or edema Skin: Skin color, texture, turgor normal. No rashes or lesions Lymph nodes: Cervical, supraclavicular, and axillary nodes normal. No abnormal inguinal nodes palpated Neurologic: Grossly normal   Pelvic: External genitalia:  no lesions              Urethra:  normal appearing urethra with no masses, tenderness or lesions              Bartholin's and Skene's: normal                 Vagina: normal appearing vagina with normal color and discharge, no lesions, normal discharge              Cervix: anteverted              Pap taken: no Bimanual Exam:  Uterus:  normal size, contour, position, consistency, mobility, non-tender  Adnexa: no mass, fullness, tenderness               Rectovaginal: Confirms               Anus:  normal sphincter tone, no lesions  A:  Well Woman with normal exam  Contraception  Mid cycle BTB - maybe late on timing of OCP  R/O UTI  asymptomatic  P:   Reviewed health and wellness pertinent to exam  Pap smear not taken today  Refill on OCP for a year  Follow on Urine C&S  Counseled on breast self exam, STD prevention, use and side effects of OCP's, adequate intake of calcium and vitamin D, diet and exercise return annually or prn  An After Visit Summary was printed and given to the patient.

## 2013-11-17 LAB — URINE CULTURE: Colony Count: 9000

## 2013-11-18 ENCOUNTER — Telehealth: Payer: Self-pay | Admitting: Emergency Medicine

## 2013-11-18 NOTE — Telephone Encounter (Signed)
Message copied by Joeseph Amor on Thu Nov 18, 2013  9:58 AM ------      Message from: AMUNDSON DE Gwenevere Ghazi, BROOK E      Created: Thu Nov 18, 2013  8:23 AM       Please report negative urine culture to patient. ------

## 2013-11-18 NOTE — Telephone Encounter (Signed)
Message left to return call to Cortland Crehan at [phone].    

## 2013-11-19 NOTE — Progress Notes (Signed)
Encounter reviewed by Dr. Jakavion Bilodeau Silva.  

## 2013-12-01 NOTE — Telephone Encounter (Signed)
Patient notified. Denies complaints and verbalized understanding of test results.  Routing to provider for final review. Patient agreeable to disposition. Will close encounter

## 2013-12-20 ENCOUNTER — Telehealth: Payer: Self-pay

## 2013-12-20 NOTE — Telephone Encounter (Signed)
Last AEX: 11/16/13 Last refill:11/16/13 Dispense 3 packs # 3 refill Current AEX: up to date  Pharm Duplicate Encounter closed

## 2013-12-27 ENCOUNTER — Telehealth: Payer: Self-pay | Admitting: *Deleted

## 2013-12-27 MED ORDER — NORETHINDRONE ACET-ETHINYL EST 1.5-30 MG-MCG PO TABS: 1.0000 | ORAL_TABLET | Freq: Every day | ORAL | Status: DC

## 2013-12-27 NOTE — Telephone Encounter (Signed)
Fax From: [facility] for Junell 1.5/30 mg  Last Refilled: Aex Scheduled:  Fax states: " Need 90 day supply have on file for 63 but need 84"  S/w Alexis (pharmacist) notified her that we sent in #90/3 refills 11/16/13 she said patient is needing a refill and this is the last time she could fill it. She said the insurance will only cover if we send in 4 packs which equals 84 (3 months). The pack comes with 21 in a pack 3 months is only quantity 63. Patient would need 2684 for insurance to cover. So we would need to send in 4 packs at a time for the patient's insurance to cover her OCP.  Junel 1.5/30 mg #4 packs (=84) / 3refills sent to pharmacy  Routed to provider for review, encounter closed.

## 2014-07-20 ENCOUNTER — Ambulatory Visit (INDEPENDENT_AMBULATORY_CARE_PROVIDER_SITE_OTHER): Payer: 59 | Admitting: Family Medicine

## 2014-07-20 VITALS — BP 128/70 | HR 99 | Temp 98.8°F | Resp 16 | Ht 68.2 in | Wt 261.8 lb

## 2014-07-20 DIAGNOSIS — R002 Palpitations: Secondary | ICD-10-CM

## 2014-07-20 NOTE — Assessment & Plan Note (Signed)
06/2014: EKG wnl, referred to Cardiology 05/2012: CBC, CMP, TSH, CXR, EKG unremarkable

## 2014-07-20 NOTE — Progress Notes (Signed)
   Subjective:    Patient ID: Halford ChessmanMorgan B Wrobel, female    DOB: 06/04/1991, 24 y.o.   MRN: [id number]  HPI Ms. Chryl HeckSwanson is a 24 year-old female who presents with intermitted palpitations and sharp chest pain, but is not currently symptomatic. Onset was 2 days ago, without any known trigger. She has been seen for this in the past. Character is a tightness. Location is mid-sternal. She denies any exertional quality. She notes associated palpitation, which has been "constant since Monday", but not currently. She notices that she will have her heart rate go up to the 115s intermittently. She denies any shortness of breath, swelling, diaphoresis, nausea, or dizziness.  She has been seen for this here in the past, underwent CBC, CMP, TSH, CXR, and EKG most recently in December 2014, which turned out normal and increasing her sleep regimen was recommended.  She has been under stress recently since she is seeking employment since completing her masters degree in public health. She does say she gets plenty of sleep.  PMHx: Her only cardiac risk factors are her obesity.  Family history is negative for cardiac disease or hypercoagulability -her mother had pulmonary emboli but was studied and felt to be negative for inherited coagulopathy   SocHx: Non-smoker. No alcohol. Does not drink much caffeine. No illicit drugs.  Review of Systems As per HPI, otherwise 7 point ROS was negative.    Objective:   Physical Exam BP 128/70 mmHg  Pulse 99  Temp(Src) 98.8 F (37.1 C) (Oral)  Resp 16  Ht 5' 8.2" (1.732 m)  Wt 261 lb 12.8 oz (118.752 kg)  BMI 39.59 kg/m2  SpO2 100%  LMP 07/16/2014 General appearance: alert, cooperative and appears stated age Back: symmetric, no curvature. ROM normal. No CVA tenderness. Lungs: clear to auscultation bilaterally Heart: regular rate and rhythm, S1, S2 normal, no murmur, click, rub or gallop Extremities: extremities normal, atraumatic, no cyanosis or edema Pulses: 2+  and symmetric    EKG: NSR, Rate 87. No ST elevation or depression. Non-specific T wave inversion in lead III. No QT prolongation. PR slightly lenthened at 154ms. No LVH criteria.  Assessment & Plan:  1. Intermittent Palpitations, likely paroxysmal SVT.  Currently asymptomatic -EKG performed here today: unremarkable as above -Drew CBC, BMP, TSH, Lipids, HgbA1c. -Recommend follow-up with Cardiology within 1 week, referral placed. -If chest pain recurs, dizziness, palpitations, diaphoresis, shortness of breath, or for any other concerns, then call 911 or go directly to the ED. Patient verbalized understanding and agreement.  Dr. Joellyn HaffPick-Jacobs, DO Sports Medicine Fellow

## 2014-07-20 NOTE — Patient Instructions (Signed)

## 2014-07-21 LAB — BASIC METABOLIC PANEL
BUN: 14 mg/dL (ref 6–23)
CHLORIDE: 105 meq/L (ref 96–112)
CO2: 24 mEq/L (ref 19–32)
Calcium: 9.4 mg/dL (ref 8.4–10.5)
Creat: 0.7 mg/dL (ref 0.50–1.10)
Glucose, Bld: 89 mg/dL (ref 70–99)
POTASSIUM: 4.3 meq/L (ref 3.5–5.3)
Sodium: 138 mEq/L (ref 135–145)

## 2014-07-21 LAB — CBC WITH DIFFERENTIAL/PLATELET
BASOS PCT: 0 % (ref 0–1)
Basophils Absolute: 0 10*3/uL (ref 0.0–0.1)
EOS PCT: 1 % (ref 0–5)
Eosinophils Absolute: 0.1 10*3/uL (ref 0.0–0.7)
HCT: 37.7 % (ref 36.0–46.0)
HEMOGLOBIN: 12.6 g/dL (ref 12.0–15.0)
Lymphocytes Relative: 20 % (ref 12–46)
Lymphs Abs: 2.3 10*3/uL (ref 0.7–4.0)
MCH: 27.6 pg (ref 26.0–34.0)
MCHC: 33.4 g/dL (ref 30.0–36.0)
MCV: 82.7 fL (ref 78.0–100.0)
MONO ABS: 0.8 10*3/uL (ref 0.1–1.0)
MONOS PCT: 7 % (ref 3–12)
MPV: 10.4 fL (ref 8.6–12.4)
NEUTROS ABS: 8.4 10*3/uL — AB (ref 1.7–7.7)
NEUTROS PCT: 72 % (ref 43–77)
PLATELETS: 358 10*3/uL (ref 150–400)
RBC: 4.56 MIL/uL (ref 3.87–5.11)
RDW: 13.4 % (ref 11.5–15.5)
WBC: 11.6 10*3/uL — ABNORMAL HIGH (ref 4.0–10.5)

## 2014-07-21 LAB — HEMOGLOBIN A1C
HEMOGLOBIN A1C: 5.9 % — AB (ref <5.7)
Mean Plasma Glucose: 123 mg/dL — ABNORMAL HIGH (ref <117)

## 2014-07-21 LAB — TSH: TSH: 1.651 u[IU]/mL (ref 0.350–4.500)

## 2014-07-21 LAB — LIPID PANEL
CHOL/HDL RATIO: 4.5 ratio
CHOLESTEROL: 198 mg/dL (ref 0–200)
HDL: 44 mg/dL (ref >39)
LDL CALC: 134 mg/dL — AB (ref 0–99)
TRIGLYCERIDES: 98 mg/dL (ref <150)
VLDL: 20 mg/dL (ref 0–40)

## 2014-08-10 ENCOUNTER — Ambulatory Visit (INDEPENDENT_AMBULATORY_CARE_PROVIDER_SITE_OTHER): Payer: 59 | Admitting: Physician Assistant

## 2014-08-10 ENCOUNTER — Encounter: Payer: Self-pay | Admitting: Physician Assistant

## 2014-08-10 VITALS — BP 136/82 | HR 101 | Temp 98.5°F | Resp 16 | Ht 66.5 in | Wt 260.4 lb

## 2014-08-10 DIAGNOSIS — J029 Acute pharyngitis, unspecified: Secondary | ICD-10-CM

## 2014-08-10 LAB — POCT RAPID STREP A (OFFICE): Rapid Strep A Screen: NEGATIVE

## 2014-08-10 MED ORDER — MAGIC MOUTHWASH W/LIDOCAINE: 10.0000 mL | ORAL | Status: DC | PRN

## 2014-08-10 NOTE — Progress Notes (Signed)
Subjective:    Patient ID: Claudia Powers, female    DOB: 1991/01/29, 24 y.o.   MRN: [id number]   PCP: No PCP Per Patient  Chief Complaint  Patient presents with  . Sore Throat    sore throat since sunday.  right ear was hurting earlier today.  denies any other symptoms    No Known Allergies  Patient Active Problem List   Diagnosis Date Noted  . Palpitations 07/20/2014  . Severe obesity (BMI >= 40) 06/06/2013    Prior to Admission medications   Medication Sig Start Date End Date Taking? Authorizing Provider  loratadine (CLARITIN) 10 MG tablet Take 10 mg by mouth daily.   Yes Historical Provider, MD  Norethindrone Acetate-Ethinyl Estradiol (LOESTRIN 1.5/30, 21,) 1.5-30 MG-MCG tablet Take 1 tablet by mouth daily. 12/27/13  Yes Lauro FranklinPatricia Rolen-Grubb, FNP    Medical, Surgical, Family and Social History reviewed and updated.  HPI  Presents with sore throat 4 days ago. Hurts to talk and swallow. Had some RIGHT ear pain earlier today. No runny nose, sinus pressure, post-nasal drainage. A little bit of cough. Has had increased burping. No fever, chills. No nausea, vomiting or diarrhea. No hematuria. No joint or muscle pain.  Review of Systems As above.    Objective:   Physical Exam  Constitutional: She is oriented to person, place, and time. Vital signs are normal. She appears well-developed and well-nourished. She is active and cooperative. No distress.  BP 136/82 mmHg  Pulse 101  Temp(Src) 98.5 F (36.9 C) (Oral)  Resp 16  Ht 5' 6.5" (1.689 m)  Wt 260 lb 6.4 oz (118.117 kg)  BMI 41.41 kg/m2  SpO2 97%  LMP 07/16/2014  HENT:  Head: Normocephalic and atraumatic.  Right Ear: Hearing normal.  Left Ear: Hearing normal.  Eyes: Conjunctivae are normal. No scleral icterus.  Neck: Normal range of motion. Neck supple. No thyromegaly present.  Cardiovascular: Normal rate, regular rhythm and normal heart sounds.   Pulses:      Radial pulses are 2+ on the right side, and  2+ on the left side.  Pulmonary/Chest: Effort normal and breath sounds normal.  Lymphadenopathy:       Head (right side): No tonsillar, no preauricular, no posterior auricular and no occipital adenopathy present.       Head (left side): No tonsillar, no preauricular, no posterior auricular and no occipital adenopathy present.    She has no cervical adenopathy.       Right: No supraclavicular adenopathy present.       Left: No supraclavicular adenopathy present.  Neurological: She is alert and oriented to person, place, and time. No sensory deficit.  Skin: Skin is warm, dry and intact. No rash noted. No cyanosis or erythema. Nails show no clubbing.  Psychiatric: She has a normal mood and affect.   Results for orders placed or performed in visit on 08/10/14  POCT rapid strep A  Result Value Ref Range   Rapid Strep A Screen Negative Negative          Assessment & Plan:  1. Sore throat Likely viral. Anticipatory guidance provided. Supportive care. If TCx positive for strep, will send in antibiotic treatment.  - POCT rapid strep A - Culture, Group A Strep - Alum & Mag Hydroxide-Simeth (MAGIC MOUTHWASH W/LIDOCAINE) SOLN; Take 10 mLs by mouth every 2 (two) hours as needed for mouth pain.  Dispense: 360 mL; Refill: 0   Fernande Brashelle S. Curvin Hunger, PA-C Physician Assistant-Certified [facility]

## 2014-08-10 NOTE — Patient Instructions (Signed)
Get plenty of rest and drink at least 64 ounces of water daily. Use ibuprofen and/or acetaminophen as needed.

## 2014-08-12 ENCOUNTER — Ambulatory Visit (INDEPENDENT_AMBULATORY_CARE_PROVIDER_SITE_OTHER): Payer: 59 | Admitting: Cardiology

## 2014-08-12 ENCOUNTER — Encounter: Payer: Self-pay | Admitting: Cardiology

## 2014-08-12 VITALS — BP 134/86 | HR 102 | Ht 66.5 in | Wt 257.4 lb

## 2014-08-12 DIAGNOSIS — R000 Tachycardia, unspecified: Secondary | ICD-10-CM

## 2014-08-12 DIAGNOSIS — R002 Palpitations: Secondary | ICD-10-CM

## 2014-08-12 DIAGNOSIS — R079 Chest pain, unspecified: Secondary | ICD-10-CM

## 2014-08-12 DIAGNOSIS — I471 Supraventricular tachycardia: Secondary | ICD-10-CM

## 2014-08-12 NOTE — Progress Notes (Signed)
Cardiology Office Note   Date:  08/12/2014   ID:  Claudia Powers, DOB [birth year], MRN [id number]  PCP:  No PCP Per Patient  Cardiologist:   Quintella Reichert, MD   Chief Complaint  Patient presents with  . Palpitations  . Chest Pain      History of Present Illness: Claudia Powers is a 24 y.o. female who presents for evaluation of palpitations and chest pressure. This started back at the end of January but has had this in the past. It lasted about a week and then resolved on its own. It was located over her right chest and there was no radiation of the pain.    It was nonexertional and constant for a week.  .  She  Noticed associated palpitations with it. But the palpitations have resolved as well.   She had some palpitations in 2014 as well and EKG was normal but she never wore a heart monitor.   She denied any SOB, nausea or diaphoresis with the pain.  She denied any dizziness or syncope.  She had been under stress in seeking a job since completing her masters degree in [employer].  She has felt fine since the end of January with no other CP or palpitations.    Past Medical History  Diagnosis Date  . Allergy     takes allergy shots    Past Surgical History  Procedure Laterality Date  . Wisdom tooth extraction       Current Outpatient Prescriptions  Medication Sig Dispense Refill  . Alum & Mag Hydroxide-Simeth (MAGIC MOUTHWASH W/LIDOCAINE) SOLN Take 10 mLs by mouth every 2 (two) hours as needed for mouth pain. 360 mL 0  . loratadine (CLARITIN) 10 MG tablet Take 10 mg by mouth daily.    . Norethindrone Acetate-Ethinyl Estradiol (LOESTRIN 1.5/30, 21,) 1.5-30 MG-MCG tablet Take 1 tablet by mouth daily. 4 Package 3   No current facility-administered medications for this visit.    Allergies:   Review of patient's allergies indicates no known allergies.    Social History:  The patient  reports that she has never smoked. She has never used smokeless tobacco. She reports  that she drinks about 0.6 oz of alcohol per week. She reports that she does not use illicit drugs.   Family History:  The patient's family history includes Bladder Cancer (age of onset: 49) in her father; Breast cancer in her maternal grandmother; Cancer in her father; Cancer (age of onset: 51) in her mother; Hypertension in her father.    ROS:  Please see the history of present illness.   Otherwise, review of systems are positive for none.   All other systems are reviewed and negative.    PHYSICAL EXAM: VS:  BP 134/86 mmHg  Pulse 102  Ht 5' 6.5" (1.689 m)  Wt 257 lb 6.4 oz (116.756 kg)  BMI 40.93 kg/m2  SpO2 99%  LMP 07/16/2014 , BMI Body mass index is 40.93 kg/(m^2). GEN: Well nourished, well developed, in no acute distress HEENT: normal Neck: no JVD, carotid bruits, or masses Cardiac: RRR; no murmurs, rubs, or gallops,no edema  Respiratory:  clear to auscultation bilaterally, normal work of breathing GI: soft, nontender, nondistended, + BS MS: no deformity or atrophy Skin: warm and dry, no rash Neuro:  Strength and sensation are intact Psych: euthymic mood, full affect   EKG:  EKG was ordered today and showed sinus tachycardia with nonspecific T wave abnormality    Recent Labs:  07/20/2014: BUN 14; Creatinine 0.70; Hemoglobin 12.6; Platelets 358; Potassium 4.3; Sodium 138; TSH 1.651    Lipid Panel    Component Value Date/Time   CHOL 198 07/20/2014 1909   TRIG 98 07/20/2014 1909   HDL 44 07/20/2014 1909   CHOLHDL 4.5 07/20/2014 1909   VLDL 20 07/20/2014 1909   LDLCALC 134* 07/20/2014 1909      Wt Readings from Last 3 Encounters:  08/12/14 257 lb 6.4 oz (116.756 kg)  08/10/14 260 lb 6.4 oz (118.117 kg)  07/20/14 261 lb 12.8 oz (118.752 kg)      Other studies Reviewed: Additional studies/ records that were reviewed today include: EKG 07/20/2014 Review of the above records demonstrates: NSR with no ST changes   ASSESSMENT AND PLAN:  1.  Palpitations - lasted  for a week and have since resolved with no reoccurrence in at least 3 weeks 2.  Atypical chest pain was constant for a week on the right side and then resolved.  She has not have any further episodes. .  Her EKG is nonischemic. This is unlikely to be cardiac in origin. 3.  Sinus tachycardia - I will check a 24 hour Holter to assess average HR  Current medicines are reviewed at length with the patient today.  The patient does not have concerns regarding medicines.  The following changes have been made:  no change  Labs/ tests ordered today include: 2D echo to assess LVF and 24 hour Holter to assess tachycardia    Disposition:   FU with me PRN pending results of studies  Signed, Quintella ReichertURNER,TRACI R, MD  08/12/2014 2:07 PM    [facility] [address], [place], [place]  [address] Phone: [phone]; Fax: [phone]

## 2014-08-12 NOTE — Patient Instructions (Signed)
Your physician has requested that you have an echocardiogram. Echocardiography is a painless test that uses sound waves to create images of your heart. It provides your doctor with information about the size and shape of your heart and how well your heart's chambers and valves are working. This procedure takes approximately one hour. There are no restrictions for this procedure.  Your physician has recommended that you wear a 24-hour holter monitor. Holter monitors are medical devices that record the heart's electrical activity. Doctors most often use these monitors to diagnose arrhythmias. Arrhythmias are problems with the speed or rhythm of the heartbeat. The monitor is a small, portable device. You can wear one while you do your normal daily activities. This is usually used to diagnose what is causing palpitations/syncope (passing out).  Your physician recommends that you schedule a follow-up appointment AS NEEDED with Dr. Mayford Knifeurner pending the results of your tests.

## 2014-08-13 LAB — CULTURE, GROUP A STREP: Organism ID, Bacteria: NORMAL

## 2014-08-15 ENCOUNTER — Ambulatory Visit (INDEPENDENT_AMBULATORY_CARE_PROVIDER_SITE_OTHER): Payer: 59 | Admitting: Medical

## 2014-08-15 ENCOUNTER — Encounter: Payer: Self-pay | Admitting: Medical

## 2014-08-15 VITALS — BP 120/80 | HR 92 | Temp 98.1°F | Resp 15 | Wt 247.0 lb

## 2014-08-15 DIAGNOSIS — J039 Acute tonsillitis, unspecified: Secondary | ICD-10-CM

## 2014-08-15 DIAGNOSIS — R490 Dysphonia: Secondary | ICD-10-CM

## 2014-08-15 MED ORDER — CEFTRIAXONE SODIUM 1 G IJ SOLR
1.0000 g | Freq: Once | INTRAMUSCULAR | Status: AC
  Administered 2014-08-15: 1 g via INTRAMUSCULAR

## 2014-08-15 MED ORDER — AMOXICILLIN 400 MG/5ML PO SUSR: ORAL | Status: DC

## 2014-08-15 NOTE — Addendum Note (Signed)
Addended by: Janeice RobinsonSCALES, Ashanti Ratti L on: 08/15/2014 04:21 PM   Modules accepted: Orders

## 2014-08-15 NOTE — Progress Notes (Signed)
Subjective: Here as a new patient today.    She has hx/o allergies, sees an allergist, on allergy shots and takes allergy medication daily. Not sick often.  She notes having sore throat that started over a week ago.  Went to urgent care, strep negative.  Pain is still quite bad, can't eat, hurts to swallow, left ear pain.  Can barely open her mouth in last 48 hours.  Rare cough+.   Denies fever, NVD, no wheezing, no SOB, no abdominal pain.  No sneezing, no runny nose, no itchy eyes or ears.   Boyfriend had sore throat that started the same time but he is fine now. Doesn't get sick often.  No prior strep.  Has had occasional sinus infection, but not frequently. No other aggravating or relieving factors. No other complaint.  ROS as in subjective  Objective: BP 120/80 mmHg  Pulse 92  Temp(Src) 98.1 F (36.7 C) (Oral)  Resp 15  Wt 247 lb (112.038 kg)  LMP 07/16/2014  Gen: wd, wn, nad Voice not muffled HENT: bilateral swollen tonsils, left greater than right, there is some asymmetry but no obvious abscess no deviation of the uvula, positive white exudate bilaterally, there is a strep-like odor, +erythema of tonsils, airway is patent but she can't open mouth to about 40% of normal, otherwise HEENT unremarkable.  I palpated her pharynx and no obvious fluctuance. Skin unremarkable Neck: Supple, nontender, no lymphadenopathy no thyromegaly, no mass Lungs clear   Assessment: Encounter Diagnoses  Name Primary?  . Acute tonsillitis Yes  . Hoarseness of voice     Plan: We discussed her symptoms and exam findings.  I believe she is close to getting a peritonsillar abscess but not quite there.  Rocephin 1 g IM given today, begin amoxicillin liquid oral twice daily,can use liquid over-the-counter ibuprofen for pain, Chloraseptic spray for pain.  Discussed symptoms and signs of peritonsillar abscess that would prompt an urgent recheck.  Follow-up in 24-36 hours here.

## 2014-08-17 ENCOUNTER — Ambulatory Visit (INDEPENDENT_AMBULATORY_CARE_PROVIDER_SITE_OTHER): Payer: 59 | Admitting: Medical

## 2014-08-17 VITALS — BP 110/68 | HR 99 | Temp 98.4°F | Resp 16 | Wt 250.0 lb

## 2014-08-17 DIAGNOSIS — J029 Acute pharyngitis, unspecified: Secondary | ICD-10-CM

## 2014-08-17 DIAGNOSIS — J039 Acute tonsillitis, unspecified: Secondary | ICD-10-CM

## 2014-08-17 LAB — POCT MONO (EPSTEIN BARR VIRUS): MONO, POC: NEGATIVE

## 2014-08-17 NOTE — Progress Notes (Signed)
Subjective: Here for 2 day f/u.  I saw her 2 days ago for sore throat that started over a week ago.  She had went to urgent care, strep negative.  Since pain persisted, and neck sore, couldn't open mouth far, we treated for Tonsillitis, possible developing peritonsillar abscess.  She does report some improvement, less pain, but still can't open mouth very far.  Denies fever, NVD, no wheezing, no SOB, no abdominal pain.  No sneezing, no runny nose, no itchy eyes or ears.  Doesn't get sick often.  No prior strep.  Has had occasional sinus infection, but not frequently. No other aggravating or relieving factors. No other complaint.  ROS as in subjective  Objective: BP 110/68 mmHg  Pulse 99  Temp(Src) 98.4 F (36.9 C) (Oral)  Resp 16  Wt 250 lb (113.399 kg)  LMP 07/16/2014  Gen: wd, wn, nad Voice not muffled HENT: bilateral swollen tonsils, left greater than right, there is some asymmetry but no obvious abscess no deviation of the uvula, positive white exudate bilaterally, there is a strep-like odor, +erythema of tonsils, airway is patent but she can't open mouth to about 40% of normal, otherwise HEENT unremarkable.  I palpated her pharynx and no obvious fluctuance. Skin unremarkable Neck: Supple, nontender, no lymphadenopathy no thyromegaly, no mass Lungs clear   Assessment: Encounter Diagnoses  Name Primary?  . Acute tonsillitis Yes  . Sore throat     Plan: We discussed her symptoms and exam findings.  Mono test negative. Discussed her case with supervising physician Dr. Lynelle DoctorKnapp who also examined her. At this point she will continue amoxicillin liquid oral twice daily,can use liquid over-the-counter ibuprofen for pain, Chloraseptic spray for pain.  Discussed symptoms and signs of peritonsillar abscess that would prompt an urgent recheck.  Follow-up in one week, sooner when necessary

## 2014-08-18 ENCOUNTER — Encounter: Payer: Self-pay | Admitting: *Deleted

## 2014-08-18 ENCOUNTER — Ambulatory Visit (HOSPITAL_COMMUNITY): Payer: 59 | Attending: Cardiology | Admitting: Radiology

## 2014-08-18 ENCOUNTER — Encounter (INDEPENDENT_AMBULATORY_CARE_PROVIDER_SITE_OTHER): Payer: 59

## 2014-08-18 DIAGNOSIS — R002 Palpitations: Secondary | ICD-10-CM

## 2014-08-18 DIAGNOSIS — R079 Chest pain, unspecified: Secondary | ICD-10-CM | POA: Insufficient documentation

## 2014-08-18 NOTE — Progress Notes (Signed)
Echocardiogram performed.  

## 2014-08-18 NOTE — Progress Notes (Signed)
Patient ID: Claudia Powers, female   DOB: 01-Dec-1990, 24 y.o.   MRN: [id number] [facility] 24 hour holter monitor applied to patient.

## 2014-08-24 ENCOUNTER — Telehealth: Payer: Self-pay

## 2014-08-24 DIAGNOSIS — Q211 Atrial septal defect: Secondary | ICD-10-CM

## 2014-08-24 DIAGNOSIS — Q2112 Patent foramen ovale: Secondary | ICD-10-CM

## 2014-08-24 DIAGNOSIS — R002 Palpitations: Secondary | ICD-10-CM

## 2014-08-24 NOTE — Telephone Encounter (Deleted)
Patient informed of results and verbal understanding expressed.  Limited ECHO with bubble study ordered for scheduling.

## 2014-08-24 NOTE — Telephone Encounter (Signed)
Notes Recorded by Quintella Reichertraci R Turner, MD on 08/18/2014 at 7:32 PM Please let patient know that heart function is normal and possible PFO. - please order a limited echo with bubble study to evaluate    Patient informed of results and verbal understanding expressed.  Limited ECHO with bubble study ordered for scheduling.

## 2014-08-29 ENCOUNTER — Telehealth: Payer: Self-pay | Admitting: Cardiology

## 2014-08-29 ENCOUNTER — Ambulatory Visit (HOSPITAL_COMMUNITY): Payer: 59 | Attending: Internal Medicine | Admitting: Cardiology

## 2014-08-29 DIAGNOSIS — Q211 Atrial septal defect: Secondary | ICD-10-CM

## 2014-08-29 DIAGNOSIS — Q2112 Patent foramen ovale: Secondary | ICD-10-CM

## 2014-08-29 DIAGNOSIS — R002 Palpitations: Secondary | ICD-10-CM | POA: Insufficient documentation

## 2014-08-29 NOTE — Telephone Encounter (Signed)
Please let patient know that heart monitor showed NSR with PAC's and PVC's which are benign 

## 2014-08-29 NOTE — Progress Notes (Signed)
Limited bubble study performed. 

## 2014-08-30 NOTE — Telephone Encounter (Signed)
Patient informed of monitor results and verbal understanding expressed.   

## 2014-11-18 ENCOUNTER — Encounter: Payer: Self-pay | Admitting: Nurse Practitioner

## 2014-11-18 ENCOUNTER — Ambulatory Visit (INDEPENDENT_AMBULATORY_CARE_PROVIDER_SITE_OTHER): Payer: 59 | Admitting: Nurse Practitioner

## 2014-11-18 VITALS — BP 130/78 | HR 80 | Resp 16 | Ht 66.0 in | Wt 249.0 lb

## 2014-11-18 DIAGNOSIS — Z01419 Encounter for gynecological examination (general) (routine) without abnormal findings: Secondary | ICD-10-CM | POA: Diagnosis not present

## 2014-11-18 DIAGNOSIS — Z0000 Encounter for general adult medical examination without abnormal findings: Secondary | ICD-10-CM | POA: Diagnosis not present

## 2014-11-18 MED ORDER — NORETHINDRONE ACET-ETHINYL EST 1.5-30 MG-MCG PO TABS: 1.0000 | ORAL_TABLET | Freq: Every day | ORAL | Status: DC

## 2014-11-18 NOTE — Progress Notes (Signed)
Patient ID: Claudia Powers Mally, female   DOB: 1990-08-30, 24 y.o.   MRN: [id number] 24 y.o. G0P0 Single  African American Fe here for annual exam.  menses now at 4-6 days, same partner X 7 years.  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    Patient's last menstrual period was 10/18/2014.          Sexually active: Yes.    The current method of family planning is OCP (estrogen/progesterone) and condoms Exercising: Yes.    cardio, cross training and weights Smoker:  no  Health Maintenance: Pap:  11-03-12 Normal TDaP:  More than 10 years Labs:  [facility]   reports that she has never smoked. She has never used smokeless tobacco. She reports that she drinks about 0.6 oz of alcohol per week. She reports that she does not use illicit drugs.  Past Medical History  Diagnosis Date  . Allergy     takes allergy shots    Past Surgical History  Procedure Laterality Date  . Wisdom tooth extraction      Current Outpatient Prescriptions  Medication Sig Dispense Refill  . loratadine (CLARITIN) 10 MG tablet Take 10 mg by mouth daily.    . Norethindrone Acetate-Ethinyl Estradiol (LOESTRIN 1.5/30, 21,) 1.5-30 MG-MCG tablet Take 1 tablet by mouth daily. 4 Package 3   No current facility-administered medications for this visit.    Family History  Problem Relation Age of Onset  . Cancer Mother 3652    Uterine cancer, PE  . Hypertension Father   . Bladder Cancer Father [id number]  . Cancer Father     bladder; former smoker  . Breast cancer Maternal Grandmother     ROS:  Pertinent items are noted in HPI.  Otherwise, a comprehensive ROS was negative.  Exam:   BP 130/78 mmHg  Pulse 80  Resp 16  Ht 5\' 6"  (1.676 m)  Wt 249 lb (112.946 kg)  BMI 40.21 kg/m2  LMP 10/18/2014 Height: 5\' 6"  (167.6 cm) Ht Readings from Last 3 Encounters:  11/18/14 5\' 6"  (1.676 m)  08/12/14 5' 6.5" (1.689 m)  08/10/14 5' 6.5" (1.689 m)    General appearance: alert, cooperative and appears stated age Head:  Normocephalic, without obvious abnormality, atraumatic Neck: no adenopathy, supple, symmetrical, trachea midline and thyroid normal to inspection and palpation Lungs: clear to auscultation bilaterally Breasts: normal appearance, no masses or tenderness Heart: regular rate and rhythm Abdomen: soft, non-tender; no masses,  no organomegaly Extremities: extremities normal, atraumatic, no cyanosis or edema Skin: Skin color, texture, turgor normal. No rashes or lesions Lymph nodes: Cervical, supraclavicular, and axillary nodes normal. No abnormal inguinal nodes palpated Neurologic: Grossly normal   Pelvic: External genitalia:  no lesions              Urethra:  normal appearing urethra with no masses, tenderness or lesions              Bartholin's and Skene's: normal                 Vagina: normal appearing vagina with normal color and discharge, no lesions              Cervix: anteverted              Pap taken: Yes.   Bimanual Exam:  Uterus:  normal size, contour, position, consistency, mobility, non-tender              Adnexa: no mass, fullness, tenderness  Rectovaginal: Confirms               Anus:  normal sphincter tone, no lesions  Chaperone present:  yes  A:  Well Woman with normal exam  Contraception   P:   Reviewed health and wellness pertinent to exam  Pap smear as above  Refill on OCP for a year  Counseled on breast self exam, use and side effects of OCP's, adequate intake of calcium and vitamin D, diet and exercise return annually or prn  An After Visit Summary was printed and given to the patient.

## 2014-11-18 NOTE — Patient Instructions (Signed)
General topics  Next pap or exam is  due in 1 year Take a Women's multivitamin Take 1200 mg. of calcium daily - prefer dietary If any concerns in interim to call back  Breast Self-Awareness Practicing breast self-awareness may pick up problems early, prevent significant medical complications, and possibly save your life. By practicing breast self-awareness, you can become familiar with how your breasts look and feel and if your breasts are changing. This allows you to notice changes early. It can also offer you some reassurance that your breast health is good. One way to learn what is normal for your breasts and whether your breasts are changing is to do a breast self-exam. If you find a lump or something that was not present in the past, it is best to contact your caregiver right away. Other findings that should be evaluated by your caregiver include nipple discharge, especially if it is bloody; skin changes or reddening; areas where the skin seems to be pulled in (retracted); or new lumps and bumps. Breast pain is seldom associated with cancer (malignancy), but should also be evaluated by a caregiver. BREAST SELF-EXAM The best time to examine your breasts is 5 7 days after your menstrual period is over.  ExitCare Patient Information 2013 [employer].   Exercise to Stay Healthy Exercise helps you become and stay healthy. EXERCISE IDEAS AND TIPS Choose exercises that:  You enjoy.  Fit into your day. You do not need to exercise really hard to be healthy. You can do exercises at a slow or medium level and stay healthy. You can:  Stretch before and after working out.  Try yoga, Pilates, or tai chi.  Lift weights.  Walk fast, swim, jog, run, climb stairs, bicycle, dance, or rollerskate.  Take aerobic classes. Exercises that burn about 150 calories:  Running 1  miles in 15 minutes.  Playing volleyball for 45 to 60 minutes.  Washing and waxing a car for 45 to 60  minutes.  Playing touch football for 45 minutes.  Walking 1  miles in 35 minutes.  Pushing a stroller 1  miles in 30 minutes.  Playing basketball for 30 minutes.  Raking leaves for 30 minutes.  Bicycling 5 miles in 30 minutes.  Walking 2 miles in 30 minutes.  Dancing for 30 minutes.  Shoveling snow for 15 minutes.  Swimming laps for 20 minutes.  Walking up stairs for 15 minutes.  Bicycling 4 miles in 15 minutes.  Gardening for 30 to 45 minutes.  Jumping rope for 15 minutes.  Washing windows or floors for 45 to 60 minutes. Document Released: 07/13/2010 Document Revised: 09/02/2011 Document Reviewed: 07/13/2010 ExitCare Patient Information 2013 [employer].   Other topics ( that may be useful information):    Sexually Transmitted Disease Sexually transmitted disease (STD) refers to any infection that is passed from person to person during sexual activity. This may happen by way of saliva, semen, blood, vaginal mucus, or urine. Common STDs include:  Gonorrhea.  Chlamydia.  Syphilis.  HIV/AIDS.  Genital herpes.  Hepatitis B and C.  Trichomonas.  Human papillomavirus (HPV).  Pubic lice. CAUSES  An STD may be spread by bacteria, virus, or parasite. A person can get an STD by:  Sexual intercourse with an infected person.  Sharing sex toys with an infected person.  Sharing needles with an infected person.  Having intimate contact with the genitals, mouth, or rectal areas of an infected person. SYMPTOMS  Some people may not have any symptoms, but   they can still pass the infection to others. Different STDs have different symptoms. Symptoms include:  Painful or bloody urination.  Pain in the pelvis, abdomen, vagina, anus, throat, or eyes.  Skin rash, itching, irritation, growths, or sores (lesions). These usually occur in the genital or anal area.  Abnormal vaginal discharge.  Penile discharge in men.  Soft, flesh-colored skin growths in the  genital or anal area.  Fever.  Pain or bleeding during sexual intercourse.  Swollen glands in the groin area.  Yellow skin and eyes (jaundice). This is seen with hepatitis. DIAGNOSIS  To make a diagnosis, your caregiver may:  Take a medical history.  Perform a physical exam.  Take a specimen (culture) to be examined.  Examine a sample of discharge under a microscope.  Perform blood test TREATMENT   Chlamydia, gonorrhea, trichomonas, and syphilis can be cured with antibiotic medicine.  Genital herpes, hepatitis, and HIV can be treated, but not cured, with prescribed medicines. The medicines will lessen the symptoms.  Genital warts from HPV can be treated with medicine or by freezing, burning (electrocautery), or surgery. Warts may come back.  HPV is a virus and cannot be cured with medicine or surgery.However, abnormal areas may be followed very closely by your caregiver and may be removed from the cervix, vagina, or vulva through office procedures or surgery. If your diagnosis is confirmed, your recent sexual partners need treatment. This is true even if they are symptom-free or have a negative culture or evaluation. They should not have sex until their caregiver says it is okay. HOME CARE INSTRUCTIONS  All sexual partners should be informed, tested, and treated for all STDs.  Take your antibiotics as directed. Finish them even if you start to feel better.  Only take over-the-counter or prescription medicines for pain, discomfort, or fever as directed by your caregiver.  Rest.  Eat a balanced diet and drink enough fluids to keep your urine clear or pale yellow.  Do not have sex until treatment is completed and you have followed up with your caregiver. STDs should be checked after treatment.  Keep all follow-up appointments, Pap tests, and blood tests as directed by your caregiver.  Only use latex condoms and water-soluble lubricants during sexual activity. Do not use  petroleum jelly or oils.  Avoid alcohol and illegal drugs.  Get vaccinated for HPV and hepatitis. If you have not received these vaccines in the past, talk to your caregiver about whether one or both might be right for you.  Avoid risky sex practices that can break the skin. The only way to avoid getting an STD is to avoid all sexual activity.Latex condoms and dental dams (for oral sex) will help lessen the risk of getting an STD, but will not completely eliminate the risk. SEEK MEDICAL CARE IF:   You have a fever.  You have any new or worsening symptoms. Document Released: 08/31/2002 Document Revised: 09/02/2011 Document Reviewed: 09/07/2010 [facility] Patient Information 2013 [employer].    Domestic Abuse You are being battered or abused if someone close to you hits, pushes, or physically hurts you in any way. You also are being abused if you are forced into activities. You are being sexually abused if you are forced to have sexual contact of any kind. You are being emotionally abused if you are made to feel worthless or if you are constantly threatened. It is important to remember that help is available. No one has the right to abuse you. PREVENTION OF FURTHER  ABUSE  Learn the warning signs of danger. This varies with situations but may include: the use of alcohol, threats, isolation from friends and family, or forced sexual contact. Leave if you feel that violence is going to occur.  If you are attacked or beaten, report it to the police so the abuse is documented. You do not have to press charges. The police can protect you while you or the attackers are leaving. Get the officer's name and badge number and a copy of the report.  Find someone you can trust and tell them what is happening to you: your caregiver, a nurse, clergy member, close friend or family member. Feeling ashamed is natural, but remember that you have done nothing wrong. No one deserves abuse. Document Released:  06/07/2000 Document Revised: 09/02/2011 Document Reviewed: 08/16/2010 ExitCare Patient Information 2013 [employer].    How Much is Too Much Alcohol? Drinking too much alcohol can cause injury, accidents, and health problems. These types of problems can include:   Car crashes.  Falls.  Family fighting (domestic violence).  Drowning.  Fights.  Injuries.  Burns.  Damage to certain organs.  Having a baby with birth defects. ONE DRINK CAN BE TOO MUCH WHEN YOU ARE:  Working.  Pregnant or breastfeeding.  Taking medicines. Ask your doctor.  Driving or planning to drive. If you or someone you know has a drinking problem, get help from a doctor.  Document Released: 04/06/2009 Document Revised: 09/02/2011 Document Reviewed: 04/06/2009 ExitCare Patient Information 2013 [employer].   Smoking Hazards Smoking cigarettes is extremely bad for your health. Tobacco smoke has over 200 known poisons in it. There are over 60 chemicals in tobacco smoke that cause cancer. Some of the chemicals found in cigarette smoke include:   Cyanide.  Benzene.  Formaldehyde.  Methanol (wood alcohol).  Acetylene (fuel used in welding torches).  Ammonia. Cigarette smoke also contains the poisonous gases nitrogen oxide and carbon monoxide.  Cigarette smokers have an increased risk of many serious medical problems and Smoking causes approximately:  90% of all lung cancer deaths in men.  80% of all lung cancer deaths in women.  90% of deaths from chronic obstructive lung disease. Compared with nonsmokers, smoking increases the risk of:  Coronary heart disease by 2 to 4 times.  Stroke by 2 to 4 times.  Men developing lung cancer by 23 times.  Women developing lung cancer by 13 times.  Dying from chronic obstructive lung diseases by 12 times.  . Smoking is the most preventable cause of death and disease in our society.  WHY IS SMOKING ADDICTIVE?  Nicotine is the chemical  agent in tobacco that is capable of causing addiction or dependence.  When you smoke and inhale, nicotine is absorbed rapidly into the bloodstream through your lungs. Nicotine absorbed through the lungs is capable of creating a powerful addiction. Both inhaled and non-inhaled nicotine may be addictive.  Addiction studies of cigarettes and spit tobacco show that addiction to nicotine occurs mainly during the teen years, when young people begin using tobacco products. WHAT ARE THE BENEFITS OF QUITTING?  There are many health benefits to quitting smoking.   Likelihood of developing cancer and heart disease decreases. Health improvements are seen almost immediately.  Blood pressure, pulse rate, and breathing patterns start returning to normal soon after quitting. QUITTING SMOKING   American Lung Association - 1-800-LUNGUSA  American Cancer Society - 1-800-ACS-2345 Document Released: 07/18/2004 Document Revised: 09/02/2011 Document Reviewed: 03/22/2009 ExitCare Patient Information 2013 ExitCare,   LLC.   Stress Management Stress is a state of physical or mental tension that often results from changes in your life or normal routine. Some common causes of stress are:  Death of a loved one.  Injuries or severe illnesses.  Getting fired or changing jobs.  Moving into a new home. Other causes may be:  Sexual problems.  Business or financial losses.  Taking on a large debt.  Regular conflict with someone at home or at work.  Constant tiredness from lack of sleep. It is not just bad things that are stressful. It may be stressful to:  Win the lottery.  Get married.  Buy a new car. The amount of stress that can be easily tolerated varies from person to person. Changes generally cause stress, regardless of the types of change. Too much stress can affect your health. It may lead to physical or emotional problems. Too little stress (boredom) may also become stressful. SUGGESTIONS TO  REDUCE STRESS:  Talk things over with your family and friends. It often is helpful to share your concerns and worries. If you feel your problem is serious, you may want to get help from a professional counselor.  Consider your problems one at a time instead of lumping them all together. Trying to take care of everything at once may seem impossible. List all the things you need to do and then start with the most important one. Set a goal to accomplish 2 or 3 things each day. If you expect to do too many in a single day you will naturally fail, causing you to feel even more stressed.  Do not use alcohol or drugs to relieve stress. Although you may feel better for a short time, they do not remove the problems that caused the stress. They can also be habit forming.  Exercise regularly - at least 3 times per week. Physical exercise can help to relieve that "uptight" feeling and will relax you.  The shortest distance between despair and hope is often a good night's sleep.  Go to bed and get up on time allowing yourself time for appointments without being rushed.  Take a short "time-out" period from any stressful situation that occurs during the day. Close your eyes and take some deep breaths. Starting with the muscles in your face, tense them, hold it for a few seconds, then relax. Repeat this with the muscles in your neck, shoulders, hand, stomach, back and legs.  Take good care of yourself. Eat a balanced diet and get plenty of rest.  Schedule time for having fun. Take a break from your daily routine to relax. HOME CARE INSTRUCTIONS   Call if you feel overwhelmed by your problems and feel you can no longer manage them on your own.  Return immediately if you feel like hurting yourself or someone else. Document Released: 12/04/2000 Document Revised: 09/02/2011 Document Reviewed: 07/27/2007 ExitCare Patient Information 2013 ExitCare[employer].  

## 2014-11-20 NOTE — Progress Notes (Signed)
Encounter reviewed by Dr. Mark Benecke Silva.  

## 2014-11-23 LAB — IPS PAP TEST WITH REFLEX TO HPV

## 2014-12-27 IMAGING — CR DG CHEST 2V
2 series · 2 of 2 positions shown · non-contrast
Comparison: None.

CLINICAL DATA: Chest pain

EXAM:
CHEST  2 VIEW

[PA]
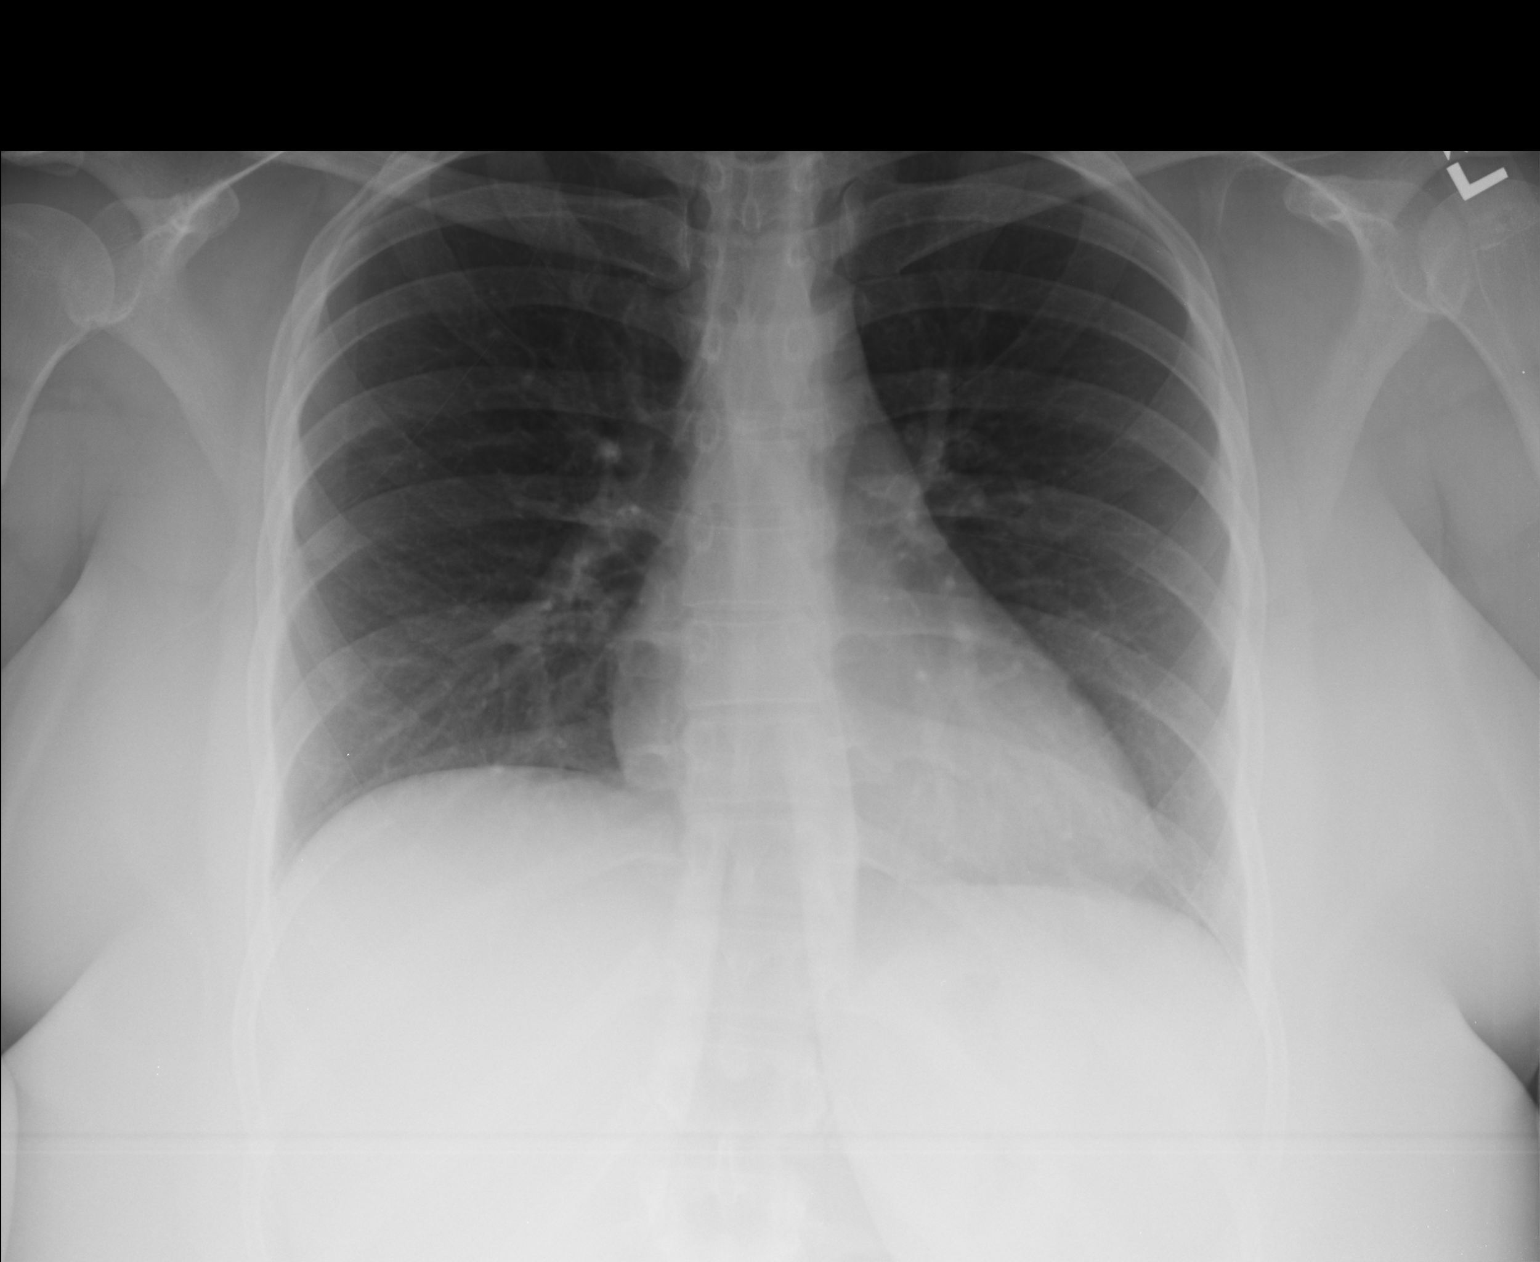

[lateral]
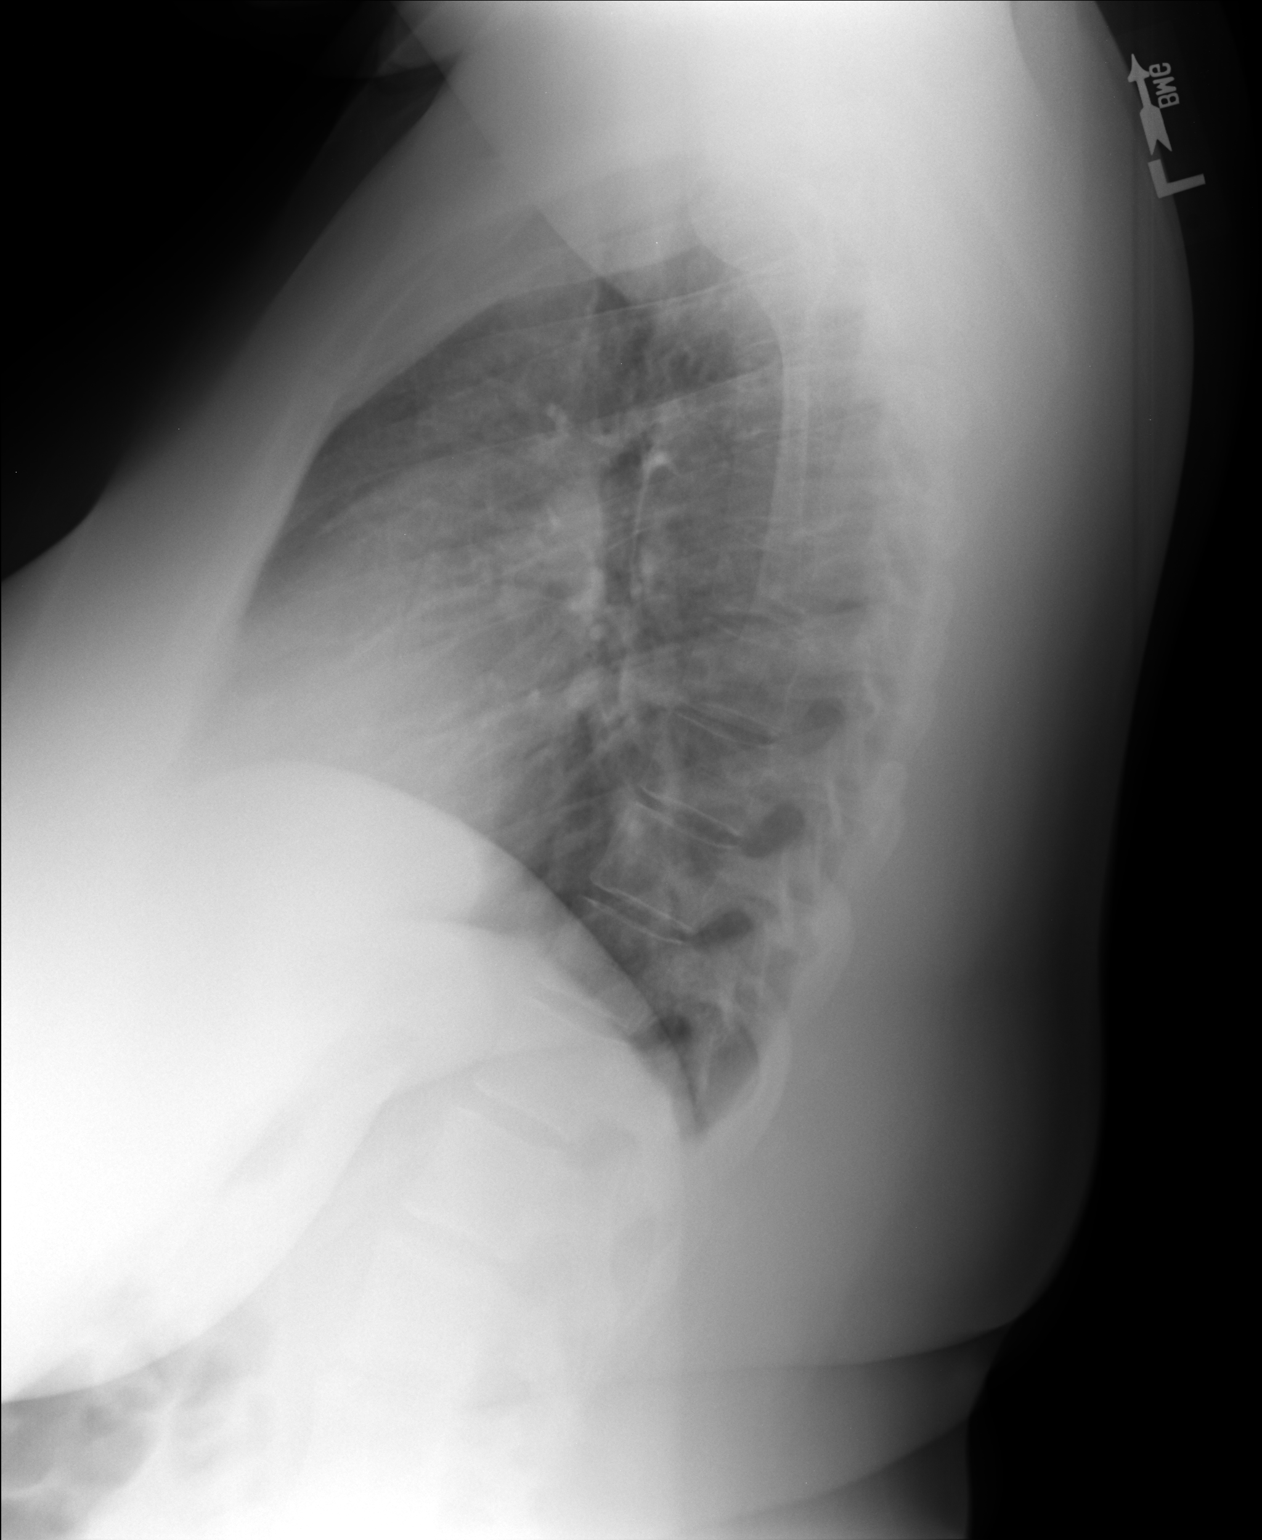

[2 of 2 positions shown; findings below may reference images not displayed]

FINDINGS: The heart size and mediastinal contours are within normal limits.
Both lungs are clear. The visualized skeletal structures are
unremarkable.
IMPRESSION: No active cardiopulmonary disease.

## 2015-03-08 ENCOUNTER — Other Ambulatory Visit: Payer: Self-pay | Admitting: Nurse Practitioner

## 2015-03-09 NOTE — Telephone Encounter (Signed)
Medication refill request: Junel Last AEX:  11/18/14 with PG Next AEX: 11/18/15 with PG Last MMG (if hormonal medication request):  Refill authorized: please advise.

## 2015-11-21 ENCOUNTER — Ambulatory Visit: Payer: 59 | Admitting: Nurse Practitioner

## 2015-12-11 ENCOUNTER — Ambulatory Visit (INDEPENDENT_AMBULATORY_CARE_PROVIDER_SITE_OTHER): Payer: No Typology Code available for payment source | Admitting: Nurse Practitioner

## 2015-12-11 ENCOUNTER — Encounter: Payer: Self-pay | Admitting: Nurse Practitioner

## 2015-12-11 VITALS — BP 112/80 | HR 76 | Resp 16 | Ht 67.0 in | Wt 216.0 lb

## 2015-12-11 DIAGNOSIS — Z01419 Encounter for gynecological examination (general) (routine) without abnormal findings: Secondary | ICD-10-CM

## 2015-12-11 DIAGNOSIS — Z803 Family history of malignant neoplasm of breast: Secondary | ICD-10-CM

## 2015-12-11 DIAGNOSIS — Z0000 Encounter for general adult medical examination without abnormal findings: Secondary | ICD-10-CM | POA: Diagnosis not present

## 2015-12-11 LAB — COMPREHENSIVE METABOLIC PANEL
ALBUMIN: 3.9 g/dL (ref 3.6–5.1)
ALT: 13 U/L (ref 6–29)
AST: 14 U/L (ref 10–30)
Alkaline Phosphatase: 73 U/L (ref 33–115)
BUN: 16 mg/dL (ref 7–25)
CHLORIDE: 101 mmol/L (ref 98–110)
CO2: 28 mmol/L (ref 20–31)
CREATININE: 0.98 mg/dL (ref 0.50–1.10)
Calcium: 9.3 mg/dL (ref 8.6–10.2)
GLUCOSE: 79 mg/dL (ref 65–99)
Potassium: 4.7 mmol/L (ref 3.5–5.3)
SODIUM: 137 mmol/L (ref 135–146)
Total Bilirubin: 0.3 mg/dL (ref 0.2–1.2)
Total Protein: 6.8 g/dL (ref 6.1–8.1)

## 2015-12-11 LAB — POCT URINALYSIS DIPSTICK
BILIRUBIN UA: NEGATIVE
GLUCOSE UA: NEGATIVE
Ketones, UA: NEGATIVE
NITRITE UA: NEGATIVE
Protein, UA: NEGATIVE
RBC UA: NEGATIVE
Urobilinogen, UA: NEGATIVE
pH, UA: 5

## 2015-12-11 LAB — LIPID PANEL
Cholesterol: 185 mg/dL (ref 125–200)
HDL: 56 mg/dL (ref >=46)
LDL CALC: 111 mg/dL (ref <130)
TRIGLYCERIDES: 92 mg/dL (ref <150)
Total CHOL/HDL Ratio: 3.3 Ratio (ref <=5.0)
VLDL: 18 mg/dL (ref <30)

## 2015-12-11 LAB — CBC
HCT: 38.2 % (ref 35.0–45.0)
Hemoglobin: 12.4 g/dL (ref 11.7–15.5)
MCH: 28.3 pg (ref 27.0–33.0)
MCHC: 32.5 g/dL (ref 32.0–36.0)
MCV: 87.2 fL (ref 80.0–100.0)
MPV: 10.9 fL (ref 7.5–12.5)
Platelets: 308 10*3/uL (ref 140–400)
RBC: 4.38 MIL/uL (ref 3.80–5.10)
RDW: 13 % (ref 11.0–15.0)
WBC: 11.7 10*3/uL — ABNORMAL HIGH (ref 3.8–10.8)

## 2015-12-11 LAB — TSH: TSH: 2.24 m[IU]/L

## 2015-12-11 MED ORDER — NORETHINDRONE ACET-ETHINYL EST 1.5-30 MG-MCG PO TABS: 1.0000 | ORAL_TABLET | Freq: Every day | ORAL | Status: DC

## 2015-12-11 MED ORDER — FLUCONAZOLE 150 MG PO TABS: 150.0000 mg | ORAL_TABLET | Freq: Once | ORAL | Status: DC

## 2015-12-11 NOTE — Patient Instructions (Addendum)
General topics  Next pap or exam is  due in 1 year Take a Women's multivitamin Take 1200 mg. of calcium daily - prefer dietary If any concerns in interim to call back  Breast Self-Awareness Practicing breast self-awareness may pick up problems early, prevent significant medical complications, and possibly save your life. By practicing breast self-awareness, you can become familiar with how your breasts look and feel and if your breasts are changing. This allows you to notice changes early. It can also offer you some reassurance that your breast health is good. One way to learn what is normal for your breasts and whether your breasts are changing is to do a breast self-exam. If you find a lump or something that was not present in the past, it is best to contact your caregiver right away. Other findings that should be evaluated by your caregiver include nipple discharge, especially if it is bloody; skin changes or reddening; areas where the skin seems to be pulled in (retracted); or new lumps and bumps. Breast pain is seldom associated with cancer (malignancy), but should also be evaluated by a caregiver. BREAST SELF-EXAM The best time to examine your breasts is 5 7 days after your menstrual period is over.  ExitCare Patient Information 2013 [employer].   Exercise to Stay Healthy Exercise helps you become and stay healthy. EXERCISE IDEAS AND TIPS Choose exercises that:  You enjoy.  Fit into your day. You do not need to exercise really hard to be healthy. You can do exercises at a slow or medium level and stay healthy. You can:  Stretch before and after working out.  Try yoga, Pilates, or tai chi.  Lift weights.  Walk fast, swim, jog, run, climb stairs, bicycle, dance, or rollerskate.  Take aerobic classes. Exercises that burn about 150 calories:  Running 1  miles in 15 minutes.  Playing volleyball for 45 to 60 minutes.  Washing and waxing a car for 45 to 60  minutes.  Playing touch football for 45 minutes.  Walking 1  miles in 35 minutes.  Pushing a stroller 1  miles in 30 minutes.  Playing basketball for 30 minutes.  Raking leaves for 30 minutes.  Bicycling 5 miles in 30 minutes.  Walking 2 miles in 30 minutes.  Dancing for 30 minutes.  Shoveling snow for 15 minutes.  Swimming laps for 20 minutes.  Walking up stairs for 15 minutes.  Bicycling 4 miles in 15 minutes.  Gardening for 30 to 45 minutes.  Jumping rope for 15 minutes.  Washing windows or floors for 45 to 60 minutes. Document Released: 07/13/2010 Document Revised: 09/02/2011 Document Reviewed: 07/13/2010 ExitCare Patient Information 2013 ExitCare[employer].   Other topics ( that may be useful information):    Sexually Transmitted Disease Sexually transmitted disease (STD) refers to any infection that is passed from person to person during sexual activity. This may happen by way of saliva, semen, blood, vaginal mucus, or urine. Common STDs include:  Gonorrhea.  Chlamydia.  Syphilis.  HIV/AIDS.  Genital herpes.  Hepatitis B and C.  Trichomonas.  Human papillomavirus (HPV).  Pubic lice. CAUSES  An STD may be spread by bacteria, virus, or parasite. A person can get an STD by:  Sexual intercourse with an infected person.  Sharing sex toys with an infected person.  Sharing needles with an infected person.  Having intimate contact with the genitals, mouth, or rectal areas of an infected person. SYMPTOMS  Some people may not have any symptoms, but   they can still pass the infection to others. Different STDs have different symptoms. Symptoms include:  Painful or bloody urination.  Pain in the pelvis, abdomen, vagina, anus, throat, or eyes.  Skin rash, itching, irritation, growths, or sores (lesions). These usually occur in the genital or anal area.  Abnormal vaginal discharge.  Penile discharge in men.  Soft, flesh-colored skin growths in the  genital or anal area.  Fever.  Pain or bleeding during sexual intercourse.  Swollen glands in the groin area.  Yellow skin and eyes (jaundice). This is seen with hepatitis. DIAGNOSIS  To make a diagnosis, your caregiver may:  Take a medical history.  Perform a physical exam.  Take a specimen (culture) to be examined.  Examine a sample of discharge under a microscope.  Perform blood test TREATMENT   Chlamydia, gonorrhea, trichomonas, and syphilis can be cured with antibiotic medicine.  Genital herpes, hepatitis, and HIV can be treated, but not cured, with prescribed medicines. The medicines will lessen the symptoms.  Genital warts from HPV can be treated with medicine or by freezing, burning (electrocautery), or surgery. Warts may come back.  HPV is a virus and cannot be cured with medicine or surgery.However, abnormal areas may be followed very closely by your caregiver and may be removed from the cervix, vagina, or vulva through office procedures or surgery. If your diagnosis is confirmed, your recent sexual partners need treatment. This is true even if they are symptom-free or have a negative culture or evaluation. They should not have sex until their caregiver says it is okay. HOME CARE INSTRUCTIONS  All sexual partners should be informed, tested, and treated for all STDs.  Take your antibiotics as directed. Finish them even if you start to feel better.  Only take over-the-counter or prescription medicines for pain, discomfort, or fever as directed by your caregiver.  Rest.  Eat a balanced diet and drink enough fluids to keep your urine clear or pale yellow.  Do not have sex until treatment is completed and you have followed up with your caregiver. STDs should be checked after treatment.  Keep all follow-up appointments, Pap tests, and blood tests as directed by your caregiver.  Only use latex condoms and water-soluble lubricants during sexual activity. Do not use  petroleum jelly or oils.  Avoid alcohol and illegal drugs.  Get vaccinated for HPV and hepatitis. If you have not received these vaccines in the past, talk to your caregiver about whether one or both might be right for you.  Avoid risky sex practices that can break the skin. The only way to avoid getting an STD is to avoid all sexual activity.Latex condoms and dental dams (for oral sex) will help lessen the risk of getting an STD, but will not completely eliminate the risk. SEEK MEDICAL CARE IF:   You have a fever.  You have any new or worsening symptoms. Document Released: 08/31/2002 Document Revised: 09/02/2011 Document Reviewed: 09/07/2010 [facility] Patient Information 2013 [place].    Domestic Abuse You are being battered or abused if someone close to you hits, pushes, or physically hurts you in any way. You also are being abused if you are forced into activities. You are being sexually abused if you are forced to have sexual contact of any kind. You are being emotionally abused if you are made to feel worthless or if you are constantly threatened. It is important to remember that help is available. No one has the right to abuse you. PREVENTION OF FURTHER  ABUSE  Learn the warning signs of danger. This varies with situations but may include: the use of alcohol, threats, isolation from friends and family, or forced sexual contact. Leave if you feel that violence is going to occur.  If you are attacked or beaten, report it to the police so the abuse is documented. You do not have to press charges. The police can protect you while you or the attackers are leaving. Get the officer's name and badge number and a copy of the report.  Find someone you can trust and tell them what is happening to you: your caregiver, a nurse, clergy member, close friend or family member. Feeling ashamed is natural, but remember that you have done nothing wrong. No one deserves abuse. Document Released:  06/07/2000 Document Revised: 09/02/2011 Document Reviewed: 08/16/2010 ExitCare Patient Information 2013 ExitCare[employer].    How Much is Too Much Alcohol? Drinking too much alcohol can cause injury, accidents, and health problems. These types of problems can include:   Car crashes.  Falls.  Family fighting (domestic violence).  Drowning.  Fights.  Injuries.  Burns.  Damage to certain organs.  Having a baby with birth defects. ONE DRINK CAN BE TOO MUCH WHEN YOU ARE:  Working.  Pregnant or breastfeeding.  Taking medicines. Ask your doctor.  Driving or planning to drive. If you or someone you know has a drinking problem, get help from a doctor.  Document Released: 04/06/2009 Document Revised: 09/02/2011 Document Reviewed: 04/06/2009 ExitCare Patient Information 2013 ExitCare[employer].   Smoking Hazards Smoking cigarettes is extremely bad for your health. Tobacco smoke has over 200 known poisons in it. There are over 60 chemicals in tobacco smoke that cause cancer. Some of the chemicals found in cigarette smoke include:   Cyanide.  Benzene.  Formaldehyde.  Methanol (wood alcohol).  Acetylene (fuel used in welding torches).  Ammonia. Cigarette smoke also contains the poisonous gases nitrogen oxide and carbon monoxide.  Cigarette smokers have an increased risk of many serious medical problems and Smoking causes approximately:  90% of all lung cancer deaths in men.  80% of all lung cancer deaths in women.  90% of deaths from chronic obstructive lung disease. Compared with nonsmokers, smoking increases the risk of:  Coronary heart disease by 2 to 4 times.  Stroke by 2 to 4 times.  Men developing lung cancer by 23 times.  Women developing lung cancer by 13 times.  Dying from chronic obstructive lung diseases by 12 times.  . Smoking is the most preventable cause of death and disease in our society.  WHY IS SMOKING ADDICTIVE?  Nicotine is the chemical  agent in tobacco that is capable of causing addiction or dependence.  When you smoke and inhale, nicotine is absorbed rapidly into the bloodstream through your lungs. Nicotine absorbed through the lungs is capable of creating a powerful addiction. Both inhaled and non-inhaled nicotine may be addictive.  Addiction studies of cigarettes and spit tobacco show that addiction to nicotine occurs mainly during the teen years, when young people begin using tobacco products. WHAT ARE THE BENEFITS OF QUITTING?  There are many health benefits to quitting smoking.   Likelihood of developing cancer and heart disease decreases. Health improvements are seen almost immediately.  Blood pressure, pulse rate, and breathing patterns start returning to normal soon after quitting. QUITTING SMOKING   American Lung Association - 1-800-LUNGUSA  American Cancer Society - 1-800-ACS-2345 Document Released: 07/18/2004 Document Revised: 09/02/2011 Document Reviewed: 03/22/2009 ExitCare Patient Information 2013 ExitCare,   LLC.   Stress Management Stress is a state of physical or mental tension that often results from changes in your life or normal routine. Some common causes of stress are:  Death of a loved one.  Injuries or severe illnesses.  Getting fired or changing jobs.  Moving into a new home. Other causes may be:  Sexual problems.  Business or financial losses.  Taking on a large debt.  Regular conflict with someone at home or at work.  Constant tiredness from lack of sleep. It is not just bad things that are stressful. It may be stressful to:  Win the lottery.  Get married.  Buy a new car. The amount of stress that can be easily tolerated varies from person to person. Changes generally cause stress, regardless of the types of change. Too much stress can affect your health. It may lead to physical or emotional problems. Too little stress (boredom) may also become stressful. SUGGESTIONS TO  REDUCE STRESS:  Talk things over with your family and friends. It often is helpful to share your concerns and worries. If you feel your problem is serious, you may want to get help from a professional counselor.  Consider your problems one at a time instead of lumping them all together. Trying to take care of everything at once may seem impossible. List all the things you need to do and then start with the most important one. Set a goal to accomplish 2 or 3 things each day. If you expect to do too many in a single day you will naturally fail, causing you to feel even more stressed.  Do not use alcohol or drugs to relieve stress. Although you may feel better for a short time, they do not remove the problems that caused the stress. They can also be habit forming.  Exercise regularly - at least 3 times per week. Physical exercise can help to relieve that "uptight" feeling and will relax you.  The shortest distance between despair and hope is often a good night's sleep.  Go to bed and get up on time allowing yourself time for appointments without being rushed.  Take a short "time-out" period from any stressful situation that occurs during the day. Close your eyes and take some deep breaths. Starting with the muscles in your face, tense them, hold it for a few seconds, then relax. Repeat this with the muscles in your neck, shoulders, hand, stomach, back and legs.  Take good care of yourself. Eat a balanced diet and get plenty of rest.  Schedule time for having fun. Take a break from your daily routine to relax. HOME CARE INSTRUCTIONS   Call if you feel overwhelmed by your problems and feel you can no longer manage them on your own.  Return immediately if you feel like hurting yourself or someone else. Document Released: 12/04/2000 Document Revised: 09/02/2011 Document Reviewed: 07/27/2007 [facility] Patient Information 2013 [place].   Check with aunt to see if BRCA I/II testing - if +  then you need testing. MMR II date

## 2015-12-11 NOTE — Progress Notes (Signed)
25 y.o. G0P0 Single  African American Fe here for annual exam.  Menses now at 4-5 days, no cramps, some PMS.  Same partner for 8 yrs. He completed law school but has not passed the bar exam.  He feels very frustrated.  They are still considering a winter wedding for 2018.   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  Patient's last menstrual period was 12/08/2015.          Sexually active: Yes.    The current method of family planning is OCP (estrogen/progesterone).    Exercising: Yes.    Cardio, strength training Smoker:  no  Health Maintenance: Pap:  11/18/14 Neg.  MMG:  Never TDaP:  11/03/2007 HIV: 11/03/12 Neg Labs: Here UA: trace leuk's   reports that she has never smoked. She has never used smokeless tobacco. She reports that she drinks about 0.6 oz of alcohol per week. She reports that she does not use illicit drugs.  Past Medical History  Diagnosis Date  . Allergy     takes allergy shots    Past Surgical History  Procedure Laterality Date  . Wisdom tooth extraction      Current Outpatient Prescriptions  Medication Sig Dispense Refill  . betamethasone dipropionate (DIPROLENE) 0.05 % ointment     . cetirizine (ZYRTEC) 10 MG tablet Take 10 mg by mouth.    . NON FORMULARY     . Norethindrone Acetate-Ethinyl Estradiol (JUNEL 1.5/30) 1.5-30 MG-MCG tablet Take 1 tablet by mouth daily. 84 tablet 4  . fexofenadine (ALLEGRA) 180 MG tablet Take 180 mg by mouth. Reported on 12/11/2015    . fluconazole (DIFLUCAN) 150 MG tablet Take 1 tablet (150 mg total) by mouth once. Take one tablet.  Repeat in 48 hours if symptoms are not completely resolved. 2 tablet 0  . loratadine (CLARITIN) 10 MG tablet Take 10 mg by mouth daily. Reported on 12/11/2015     No current facility-administered medications for this visit.    Family History  Problem Relation Age of Onset  . Uterine cancer Mother 52    Uterine cancer, PE  . Hypertension Father    . Bladder Cancer Father 53  . Bladder Cancer Father     bladder; former smoker  . Breast cancer Maternal Grandmother     late 48's  . Breast cancer Maternal Aunt     early 57's with recurence and BRCA + ?    ROS:  Pertinent items are noted in HPI.  Otherwise, a comprehensive ROS was negative.  Exam:   BP 112/80 mmHg  Pulse 76  Resp 16  Ht '5\' 7"'  (1.702 m)  Wt 216 lb (97.977 kg)  BMI 33.82 kg/m2  LMP 12/08/2015 Height: '5\' 7"'  (170.2 cm) Ht Readings from Last 3 Encounters:  12/11/15 '5\' 7"'  (1.702 m)  11/18/14 '5\' 6"'  (1.676 m)  08/12/14 5' 6.5" (1.689 m)    General appearance: alert, cooperative and appears stated age Head: Normocephalic, without obvious abnormality, atraumatic Neck: no adenopathy, supple, symmetrical, trachea midline and thyroid normal to inspection and palpation Lungs: clear to auscultation bilaterally Breasts: normal appearance, no masses or tenderness Heart: regular rate and rhythm Abdomen: soft, non-tender; no masses,  no organomegaly Extremities: extremities normal, atraumatic, no cyanosis or edema Skin: Skin color, texture, turgor normal. No rashes or lesions Lymph nodes: Cervical, supraclavicular, and axillary nodes normal. No abnormal inguinal nodes palpated Neurologic: Grossly normal   Pelvic: External genitalia:  no lesions              Urethra:  normal appearing urethra with no masses, tenderness or lesions              Bartholin's and Skene's: normal                 Vagina: normal appearing vagina with normal color and discharge, no lesions              Cervix: anteverted              Pap taken: No. Bimanual Exam:  Uterus:  normal size, contour, position, consistency, mobility, non-tender              Adnexa: no mass, fullness, tenderness               Rectovaginal: Confirms               Anus:  normal sphincter tone, no lesions  Chaperone present: no  A:  Well Woman with normal exam  Contraception   [place]: MA with breast cancer, Mother  with uterine cancer, father with bladder cancer.   P:   Reviewed health and wellness pertinent to exam  Pap smear as above  Refill on OCP for a year  She is given information about BRCA testing if her aunt is BRCA positive.  Counseled on breast self exam, use and side effects of OCP's, adequate intake of calcium and vitamin D, diet and exercise return annually or prn  An After Visit Summary was printed and given to the patient.

## 2015-12-12 LAB — VITAMIN D 25 HYDROXY (VIT D DEFICIENCY, FRACTURES): VIT D 25 HYDROXY: 40 ng/mL (ref 30–100)

## 2015-12-13 NOTE — Progress Notes (Signed)
Encounter reviewed Zenith Kercheval, MD   

## 2016-05-10 ENCOUNTER — Other Ambulatory Visit: Payer: Self-pay

## 2016-05-10 NOTE — Telephone Encounter (Signed)
Medication refill request: JUNEL  Last AEX:  12/11/15 PG Next AEX: 12/11/16 Last MMG (if hormonal medication request): n/a Refill authorized: refused

## 2016-12-11 ENCOUNTER — Ambulatory Visit (INDEPENDENT_AMBULATORY_CARE_PROVIDER_SITE_OTHER): Payer: No Typology Code available for payment source | Admitting: Nurse Practitioner

## 2016-12-11 ENCOUNTER — Encounter: Payer: Self-pay | Admitting: Nurse Practitioner

## 2016-12-11 VITALS — BP 120/66 | HR 64 | Ht 67.0 in | Wt 238.0 lb

## 2016-12-11 DIAGNOSIS — Z01419 Encounter for gynecological examination (general) (routine) without abnormal findings: Secondary | ICD-10-CM | POA: Diagnosis not present

## 2016-12-11 DIAGNOSIS — Z0000 Encounter for general adult medical examination without abnormal findings: Secondary | ICD-10-CM | POA: Diagnosis not present

## 2016-12-11 DIAGNOSIS — E559 Vitamin D deficiency, unspecified: Secondary | ICD-10-CM | POA: Diagnosis not present

## 2016-12-11 MED ORDER — NORETHINDRONE ACET-ETHINYL EST 1.5-30 MG-MCG PO TABS: 1.0000 | ORAL_TABLET | Freq: Every day | ORAL | 4 refills | Status: AC

## 2016-12-11 NOTE — Patient Instructions (Signed)
General topics  Next pap or exam is  due in 1 year Take a Women's multivitamin Take 1200 mg. of calcium daily - prefer dietary If any concerns in interim to call back  Breast Self-Awareness Practicing breast self-awareness may pick up problems early, prevent significant medical complications, and possibly save your life. By practicing breast self-awareness, you can become familiar with how your breasts look and feel and if your breasts are changing. This allows you to notice changes early. It can also offer you some reassurance that your breast health is good. One way to learn what is normal for your breasts and whether your breasts are changing is to do a breast self-exam. If you find a lump or something that was not present in the past, it is best to contact your caregiver right away. Other findings that should be evaluated by your caregiver include nipple discharge, especially if it is bloody; skin changes or reddening; areas where the skin seems to be pulled in (retracted); or new lumps and bumps. Breast pain is seldom associated with cancer (malignancy), but should also be evaluated by a caregiver. BREAST SELF-EXAM The best time to examine your breasts is 5 7 days after your menstrual period is over.  ExitCare Patient Information 2013 [employer].   Exercise to Stay Healthy Exercise helps you become and stay healthy. EXERCISE IDEAS AND TIPS Choose exercises that:  You enjoy.  Fit into your day. You do not need to exercise really hard to be healthy. You can do exercises at a slow or medium level and stay healthy. You can:  Stretch before and after working out.  Try yoga, Pilates, or tai chi.  Lift weights.  Walk fast, swim, jog, run, climb stairs, bicycle, dance, or rollerskate.  Take aerobic classes. Exercises that burn about 150 calories:  Running 1  miles in 15 minutes.  Playing volleyball for 45 to 60 minutes.  Washing and waxing a car for 45 to 60  minutes.  Playing touch football for 45 minutes.  Walking 1  miles in 35 minutes.  Pushing a stroller 1  miles in 30 minutes.  Playing basketball for 30 minutes.  Raking leaves for 30 minutes.  Bicycling 5 miles in 30 minutes.  Walking 2 miles in 30 minutes.  Dancing for 30 minutes.  Shoveling snow for 15 minutes.  Swimming laps for 20 minutes.  Walking up stairs for 15 minutes.  Bicycling 4 miles in 15 minutes.  Gardening for 30 to 45 minutes.  Jumping rope for 15 minutes.  Washing windows or floors for 45 to 60 minutes. Document Released: 07/13/2010 Document Revised: 09/02/2011 Document Reviewed: 07/13/2010 ExitCare Patient Information 2013 [employer].   Other topics ( that may be useful information):    Sexually Transmitted Disease Sexually transmitted disease (STD) refers to any infection that is passed from person to person during sexual activity. This may happen by way of saliva, semen, blood, vaginal mucus, or urine. Common STDs include:  Gonorrhea.  Chlamydia.  Syphilis.  HIV/AIDS.  Genital herpes.  Hepatitis B and C.  Trichomonas.  Human papillomavirus (HPV).  Pubic lice. CAUSES  An STD may be spread by bacteria, virus, or parasite. A person can get an STD by:  Sexual intercourse with an infected person.  Sharing sex toys with an infected person.  Sharing needles with an infected person.  Having intimate contact with the genitals, mouth, or rectal areas of an infected person. SYMPTOMS  Some people may not have any symptoms, but   they can still pass the infection to others. Different STDs have different symptoms. Symptoms include:  Painful or bloody urination.  Pain in the pelvis, abdomen, vagina, anus, throat, or eyes.  Skin rash, itching, irritation, growths, or sores (lesions). These usually occur in the genital or anal area.  Abnormal vaginal discharge.  Penile discharge in men.  Soft, flesh-colored skin growths in the  genital or anal area.  Fever.  Pain or bleeding during sexual intercourse.  Swollen glands in the groin area.  Yellow skin and eyes (jaundice). This is seen with hepatitis. DIAGNOSIS  To make a diagnosis, your caregiver may:  Take a medical history.  Perform a physical exam.  Take a specimen (culture) to be examined.  Examine a sample of discharge under a microscope.  Perform blood test TREATMENT   Chlamydia, gonorrhea, trichomonas, and syphilis can be cured with antibiotic medicine.  Genital herpes, hepatitis, and HIV can be treated, but not cured, with prescribed medicines. The medicines will lessen the symptoms.  Genital warts from HPV can be treated with medicine or by freezing, burning (electrocautery), or surgery. Warts may come back.  HPV is a virus and cannot be cured with medicine or surgery.However, abnormal areas may be followed very closely by your caregiver and may be removed from the cervix, vagina, or vulva through office procedures or surgery. If your diagnosis is confirmed, your recent sexual partners need treatment. This is true even if they are symptom-free or have a negative culture or evaluation. They should not have sex until their caregiver says it is okay. HOME CARE INSTRUCTIONS  All sexual partners should be informed, tested, and treated for all STDs.  Take your antibiotics as directed. Finish them even if you start to feel better.  Only take over-the-counter or prescription medicines for pain, discomfort, or fever as directed by your caregiver.  Rest.  Eat a balanced diet and drink enough fluids to keep your urine clear or pale yellow.  Do not have sex until treatment is completed and you have followed up with your caregiver. STDs should be checked after treatment.  Keep all follow-up appointments, Pap tests, and blood tests as directed by your caregiver.  Only use latex condoms and water-soluble lubricants during sexual activity. Do not use  petroleum jelly or oils.  Avoid alcohol and illegal drugs.  Get vaccinated for HPV and hepatitis. If you have not received these vaccines in the past, talk to your caregiver about whether one or both might be right for you.  Avoid risky sex practices that can break the skin. The only way to avoid getting an STD is to avoid all sexual activity.Latex condoms and dental dams (for oral sex) will help lessen the risk of getting an STD, but will not completely eliminate the risk. SEEK MEDICAL CARE IF:   You have a fever.  You have any new or worsening symptoms. Document Released: 08/31/2002 Document Revised: 09/02/2011 Document Reviewed: 09/07/2010 [facility] Patient Information 2013 [employer].    Domestic Abuse You are being battered or abused if someone close to you hits, pushes, or physically hurts you in any way. You also are being abused if you are forced into activities. You are being sexually abused if you are forced to have sexual contact of any kind. You are being emotionally abused if you are made to feel worthless or if you are constantly threatened. It is important to remember that help is available. No one has the right to abuse you. PREVENTION OF FURTHER  ABUSE  Learn the warning signs of danger. This varies with situations but may include: the use of alcohol, threats, isolation from friends and family, or forced sexual contact. Leave if you feel that violence is going to occur.  If you are attacked or beaten, report it to the police so the abuse is documented. You do not have to press charges. The police can protect you while you or the attackers are leaving. Get the officer's name and badge number and a copy of the report.  Find someone you can trust and tell them what is happening to you: your caregiver, a nurse, clergy member, close friend or family member. Feeling ashamed is natural, but remember that you have done nothing wrong. No one deserves abuse. Document Released:  06/07/2000 Document Revised: 09/02/2011 Document Reviewed: 08/16/2010 ExitCare Patient Information 2013 [employer].    How Much is Too Much Alcohol? Drinking too much alcohol can cause injury, accidents, and health problems. These types of problems can include:   Car crashes.  Falls.  Family fighting (domestic violence).  Drowning.  Fights.  Injuries.  Burns.  Damage to certain organs.  Having a baby with birth defects. ONE DRINK CAN BE TOO MUCH WHEN YOU ARE:  Working.  Pregnant or breastfeeding.  Taking medicines. Ask your doctor.  Driving or planning to drive. If you or someone you know has a drinking problem, get help from a doctor.  Document Released: 04/06/2009 Document Revised: 09/02/2011 Document Reviewed: 04/06/2009 ExitCare Patient Information 2013 [employer].   Smoking Hazards Smoking cigarettes is extremely bad for your health. Tobacco smoke has over 200 known poisons in it. There are over 60 chemicals in tobacco smoke that cause cancer. Some of the chemicals found in cigarette smoke include:   Cyanide.  Benzene.  Formaldehyde.  Methanol (wood alcohol).  Acetylene (fuel used in welding torches).  Ammonia. Cigarette smoke also contains the poisonous gases nitrogen oxide and carbon monoxide.  Cigarette smokers have an increased risk of many serious medical problems and Smoking causes approximately:  90% of all lung cancer deaths in men.  80% of all lung cancer deaths in women.  90% of deaths from chronic obstructive lung disease. Compared with nonsmokers, smoking increases the risk of:  Coronary heart disease by 2 to 4 times.  Stroke by 2 to 4 times.  Men developing lung cancer by 23 times.  Women developing lung cancer by 13 times.  Dying from chronic obstructive lung diseases by 12 times.  . Smoking is the most preventable cause of death and disease in our society.  WHY IS SMOKING ADDICTIVE?  Nicotine is the chemical  agent in tobacco that is capable of causing addiction or dependence.  When you smoke and inhale, nicotine is absorbed rapidly into the bloodstream through your lungs. Nicotine absorbed through the lungs is capable of creating a powerful addiction. Both inhaled and non-inhaled nicotine may be addictive.  Addiction studies of cigarettes and spit tobacco show that addiction to nicotine occurs mainly during the teen years, when young people begin using tobacco products. WHAT ARE THE BENEFITS OF QUITTING?  There are many health benefits to quitting smoking.   Likelihood of developing cancer and heart disease decreases. Health improvements are seen almost immediately.  Blood pressure, pulse rate, and breathing patterns start returning to normal soon after quitting. QUITTING SMOKING   American Lung Association - 1-800-LUNGUSA  American Cancer Society - 1-800-ACS-2345 Document Released: 07/18/2004 Document Revised: 09/02/2011 Document Reviewed: 03/22/2009 ExitCare Patient Information 2013 ExitCare,   LLC.   Stress Management Stress is a state of physical or mental tension that often results from changes in your life or normal routine. Some common causes of stress are:  Death of a loved one.  Injuries or severe illnesses.  Getting fired or changing jobs.  Moving into a new home. Other causes may be:  Sexual problems.  Business or financial losses.  Taking on a large debt.  Regular conflict with someone at home or at work.  Constant tiredness from lack of sleep. It is not just bad things that are stressful. It may be stressful to:  Win the lottery.  Get married.  Buy a new car. The amount of stress that can be easily tolerated varies from person to person. Changes generally cause stress, regardless of the types of change. Too much stress can affect your health. It may lead to physical or emotional problems. Too little stress (boredom) may also become stressful. SUGGESTIONS TO  REDUCE STRESS:  Talk things over with your family and friends. It often is helpful to share your concerns and worries. If you feel your problem is serious, you may want to get help from a professional counselor.  Consider your problems one at a time instead of lumping them all together. Trying to take care of everything at once may seem impossible. List all the things you need to do and then start with the most important one. Set a goal to accomplish 2 or 3 things each day. If you expect to do too many in a single day you will naturally fail, causing you to feel even more stressed.  Do not use alcohol or drugs to relieve stress. Although you may feel better for a short time, they do not remove the problems that caused the stress. They can also be habit forming.  Exercise regularly - at least 3 times per week. Physical exercise can help to relieve that "uptight" feeling and will relax you.  The shortest distance between despair and hope is often a good night's sleep.  Go to bed and get up on time allowing yourself time for appointments without being rushed.  Take a short "time-out" period from any stressful situation that occurs during the day. Close your eyes and take some deep breaths. Starting with the muscles in your face, tense them, hold it for a few seconds, then relax. Repeat this with the muscles in your neck, shoulders, hand, stomach, back and legs.  Take good care of yourself. Eat a balanced diet and get plenty of rest.  Schedule time for having fun. Take a break from your daily routine to relax. HOME CARE INSTRUCTIONS   Call if you feel overwhelmed by your problems and feel you can no longer manage them on your own.  Return immediately if you feel like hurting yourself or someone else. Document Released: 12/04/2000 Document Revised: 09/02/2011 Document Reviewed: 07/27/2007 ExitCare Patient Information 2013 ExitCare[employer].  

## 2016-12-11 NOTE — Progress Notes (Signed)
Patient ID: Claudia Powers, female   DOB: [birth year], 26 y.o.   MRN: [id number]  26 y.o. G0P0000 Single  African American Fe here for annual exam.  Menses now at 5 days. Some back cramps.  Plans to start a family 2 yrs.  Her fiance has moved to [facility] and she may be relocating there to find a job as well.   Patient's last menstrual period was 11/19/2016.          Sexually active: Yes.  Same partner x 9 years, The current method of family planning is OCP (estrogen/progesterone).    Exercising: Yes.    cardio and strength training everyday Smoker:  no  Health Maintenance: Pap: 11/18/14, Negative  11/03/12, Negative  07/23/11, ASCUS with neg HR HPV History of Abnormal Pap: ASCUS in 2013 Self Breast exams: sometimes TDaP: 11/03/07 HIV: 11/03/12 Labs:  we follow Vit D   reports that she has never smoked. She has never used smokeless tobacco. She reports that she drinks about 0.6 oz of alcohol per week . She reports that she does not use drugs.  Past Medical History:  Diagnosis Date  . Allergy    takes allergy shots    Past Surgical History:  Procedure Laterality Date  . WISDOM TOOTH EXTRACTION      Current Outpatient Prescriptions  Medication Sig Dispense Refill  . betamethasone dipropionate (DIPROLENE) 0.05 % ointment     . cetirizine (ZYRTEC) 10 MG tablet Take 10 mg by mouth.    . fexofenadine (ALLEGRA) 180 MG tablet Take 180 mg by mouth. Reported on 12/11/2015    . loratadine (CLARITIN) 10 MG tablet Take 10 mg by mouth daily. Reported on 12/11/2015    . Norethindrone Acetate-Ethinyl Estradiol (JUNEL 1.5/30) 1.5-30 MG-MCG tablet Take 1 tablet by mouth daily. 84 tablet 4   No current facility-administered medications for this visit.     Family History  Problem Relation Age of Onset  . Uterine cancer Mother 110       Uterine cancer, PE  . Hypertension Father   . Bladder Cancer Father 5       bladder; former smoker  . Breast cancer Maternal Grandmother        late 98's  .  Breast cancer Maternal Aunt        early 53's with recurence and BRCA + ?    ROS:  Pertinent items are noted in HPI.  Otherwise, a comprehensive ROS was negative.  Exam:   BP 120/66 (BP Location: Right Arm, Patient Position: Sitting, Cuff Size: Large)   Pulse 64   Ht '5\' 7"'  (1.702 m)   Wt 238 lb (108 kg)   LMP 11/19/2016   BMI 37.28 kg/m  Height: '5\' 7"'  (170.2 cm) Ht Readings from Last 3 Encounters:  12/11/16 '5\' 7"'  (1.702 m)  12/11/15 '5\' 7"'  (1.702 m)  11/18/14 '5\' 6"'  (1.676 m)    General appearance: alert, cooperative and appears stated age Head: Normocephalic, without obvious abnormality, atraumatic Neck: no adenopathy, supple, symmetrical, trachea midline and thyroid normal to inspection and palpation Lungs: clear to auscultation bilaterally Breasts: normal appearance, no masses or tenderness Heart: regular rate and rhythm Abdomen: soft, non-tender; no masses,  no organomegaly Extremities: extremities normal, atraumatic, no cyanosis or edema Skin: Skin color, texture, turgor normal. No rashes or lesions Lymph nodes: Cervical, supraclavicular, and axillary nodes normal. No abnormal inguinal nodes palpated Neurologic: Grossly normal   Pelvic: External genitalia:  no lesions  Urethra:  normal appearing urethra with no masses, tenderness or lesions              Bartholin's and Skene's: normal                 Vagina: normal appearing vagina with normal color and discharge, no lesions              Cervix: anteverted              Pap taken: No. Bimanual Exam:  Uterus:  normal size, contour, position, consistency, mobility, non-tender              Adnexa: no mass, fullness, tenderness               Rectovaginal: Confirms               Anus:  normal sphincter tone, no lesions  Chaperone present: no  A:  Well Woman with normal exam  Contraception  [place]: MA breast cancer, Mother uterine cancer, father bladder cancer.   P:   Reviewed health and wellness pertinent to  exam  Pap smear: no  She is still trying to find out status of MA- BRA testing.  Refill OCP for a year  Follow with Vit D  Counseled on breast self exam, STD prevention, HIV risk factors and prevention, use and side effects of OCP's, adequate intake of calcium and vitamin D, diet and exercise, Kegel's exercises return annually or prn  An After Visit Summary was printed and given to the patient.

## 2016-12-12 ENCOUNTER — Encounter: Payer: Self-pay | Admitting: Nurse Practitioner

## 2016-12-12 LAB — VITAMIN D 25 HYDROXY (VIT D DEFICIENCY, FRACTURES): VIT D 25 HYDROXY: 32.6 ng/mL (ref 30.0–100.0)

## 2016-12-13 NOTE — Progress Notes (Signed)
Encounter reviewed. Once she has more information on her Aunts Genetic testing, I would recommend sending her for a genetics consult.  [name], MD

## 2017-12-15 ENCOUNTER — Ambulatory Visit: Payer: No Typology Code available for payment source | Admitting: Nurse Practitioner
